# Patient Record
Sex: Male | Born: 1960 | Race: Black or African American | Hispanic: No | Marital: Single | Smoking: Current every day smoker
Health system: Southern US, Community
[De-identification: ages and names within clinical notes are randomized; demographics above are authoritative.]

## PROBLEM LIST (undated history)

## (undated) ENCOUNTER — Emergency Department: Payer: Medicare Other | Source: Home / Self Care

## (undated) ENCOUNTER — Emergency Department (HOSPITAL_COMMUNITY): Admission: EM | Payer: Self-pay | Source: Home / Self Care

## (undated) ENCOUNTER — Emergency Department: Admission: EM | Payer: Self-pay | Source: Home / Self Care

## (undated) DIAGNOSIS — K219 Gastro-esophageal reflux disease without esophagitis: Secondary | ICD-10-CM

## (undated) DIAGNOSIS — Z5900 Homelessness unspecified: Secondary | ICD-10-CM

## (undated) DIAGNOSIS — F319 Bipolar disorder, unspecified: Secondary | ICD-10-CM

## (undated) DIAGNOSIS — G4733 Obstructive sleep apnea (adult) (pediatric): Secondary | ICD-10-CM

## (undated) DIAGNOSIS — I10 Essential (primary) hypertension: Secondary | ICD-10-CM

## (undated) DIAGNOSIS — J449 Chronic obstructive pulmonary disease, unspecified: Secondary | ICD-10-CM

## (undated) DIAGNOSIS — E079 Disorder of thyroid, unspecified: Secondary | ICD-10-CM

---

## 2005-09-02 ENCOUNTER — Inpatient Hospital Stay (HOSPITAL_COMMUNITY): Admission: EM | Admit: 2005-09-02 | Discharge: 2005-09-10 | Payer: Self-pay | Admitting: Psychiatry

## 2005-09-03 ENCOUNTER — Inpatient Hospital Stay (HOSPITAL_COMMUNITY): Admission: EM | Admit: 2005-09-03 | Discharge: 2005-09-05 | Payer: Self-pay | Admitting: *Deleted

## 2005-09-06 ENCOUNTER — Ambulatory Visit: Payer: Self-pay | Admitting: Psychiatry

## 2005-10-09 ENCOUNTER — Emergency Department (HOSPITAL_COMMUNITY): Admission: EM | Admit: 2005-10-09 | Discharge: 2005-10-09 | Payer: Self-pay | Admitting: Emergency Medicine

## 2005-12-05 ENCOUNTER — Emergency Department (HOSPITAL_COMMUNITY): Admission: EM | Admit: 2005-12-05 | Discharge: 2005-12-05 | Payer: Self-pay | Admitting: Emergency Medicine

## 2005-12-27 ENCOUNTER — Emergency Department (HOSPITAL_COMMUNITY): Admission: EM | Admit: 2005-12-27 | Discharge: 2005-12-28 | Payer: Self-pay | Admitting: Emergency Medicine

## 2006-03-03 ENCOUNTER — Emergency Department (HOSPITAL_COMMUNITY): Admission: EM | Admit: 2006-03-03 | Discharge: 2006-03-03 | Payer: Self-pay | Admitting: Emergency Medicine

## 2006-07-19 ENCOUNTER — Emergency Department (HOSPITAL_COMMUNITY): Admission: EM | Admit: 2006-07-19 | Discharge: 2006-07-19 | Payer: Self-pay | Admitting: Emergency Medicine

## 2006-08-24 ENCOUNTER — Emergency Department (HOSPITAL_COMMUNITY): Admission: EM | Admit: 2006-08-24 | Discharge: 2006-08-25 | Payer: Self-pay | Admitting: Emergency Medicine

## 2006-10-30 IMAGING — CR DG CHEST 1V PORT
1 series · 1 of 1 positions shown · non-contrast
Comparison: No prior exams for comparison.

CLINICAL DATA: 44-year-old with depression, respiratory distress, fever, confusion.
 PORTABLE CHEST ? 1 VIEW:

[view not recorded]
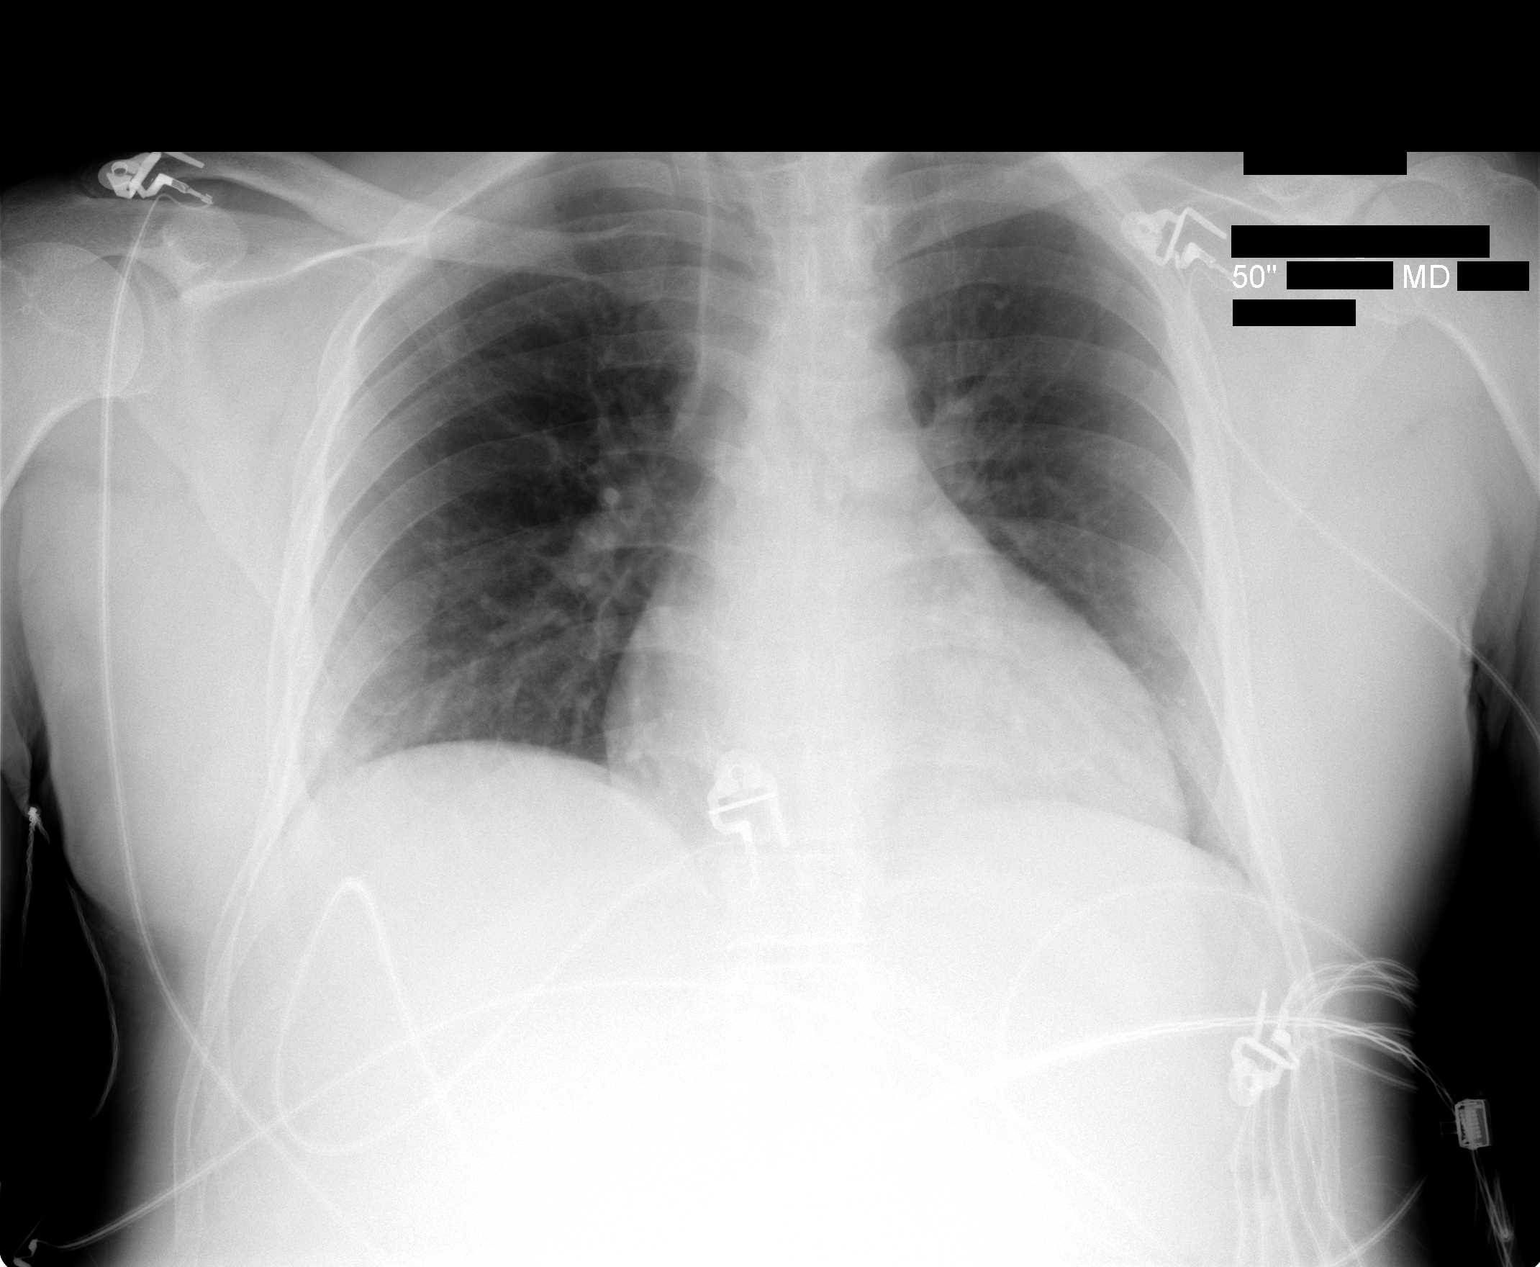

[1 of 1 positions shown; findings below may reference images not displayed]

FINDINGS: The heart size is accentuated by the technique.   There is minimal patchy density at the right lung base.   No pleural effusions or evidence for pulmonary edema.
IMPRESSION: Suspect early infiltrate, right lower lobe.

## 2006-10-31 IMAGING — CR DG CHEST 1V PORT
1 series · 1 of 1 positions shown · non-contrast
Comparison: 09/03/05.

CLINICAL DATA: 44-year-old, with depression, respiratory distress, right lower lobe infiltrate.
 PORTABLE ? 1 VIEW CHEST:

[view not recorded]
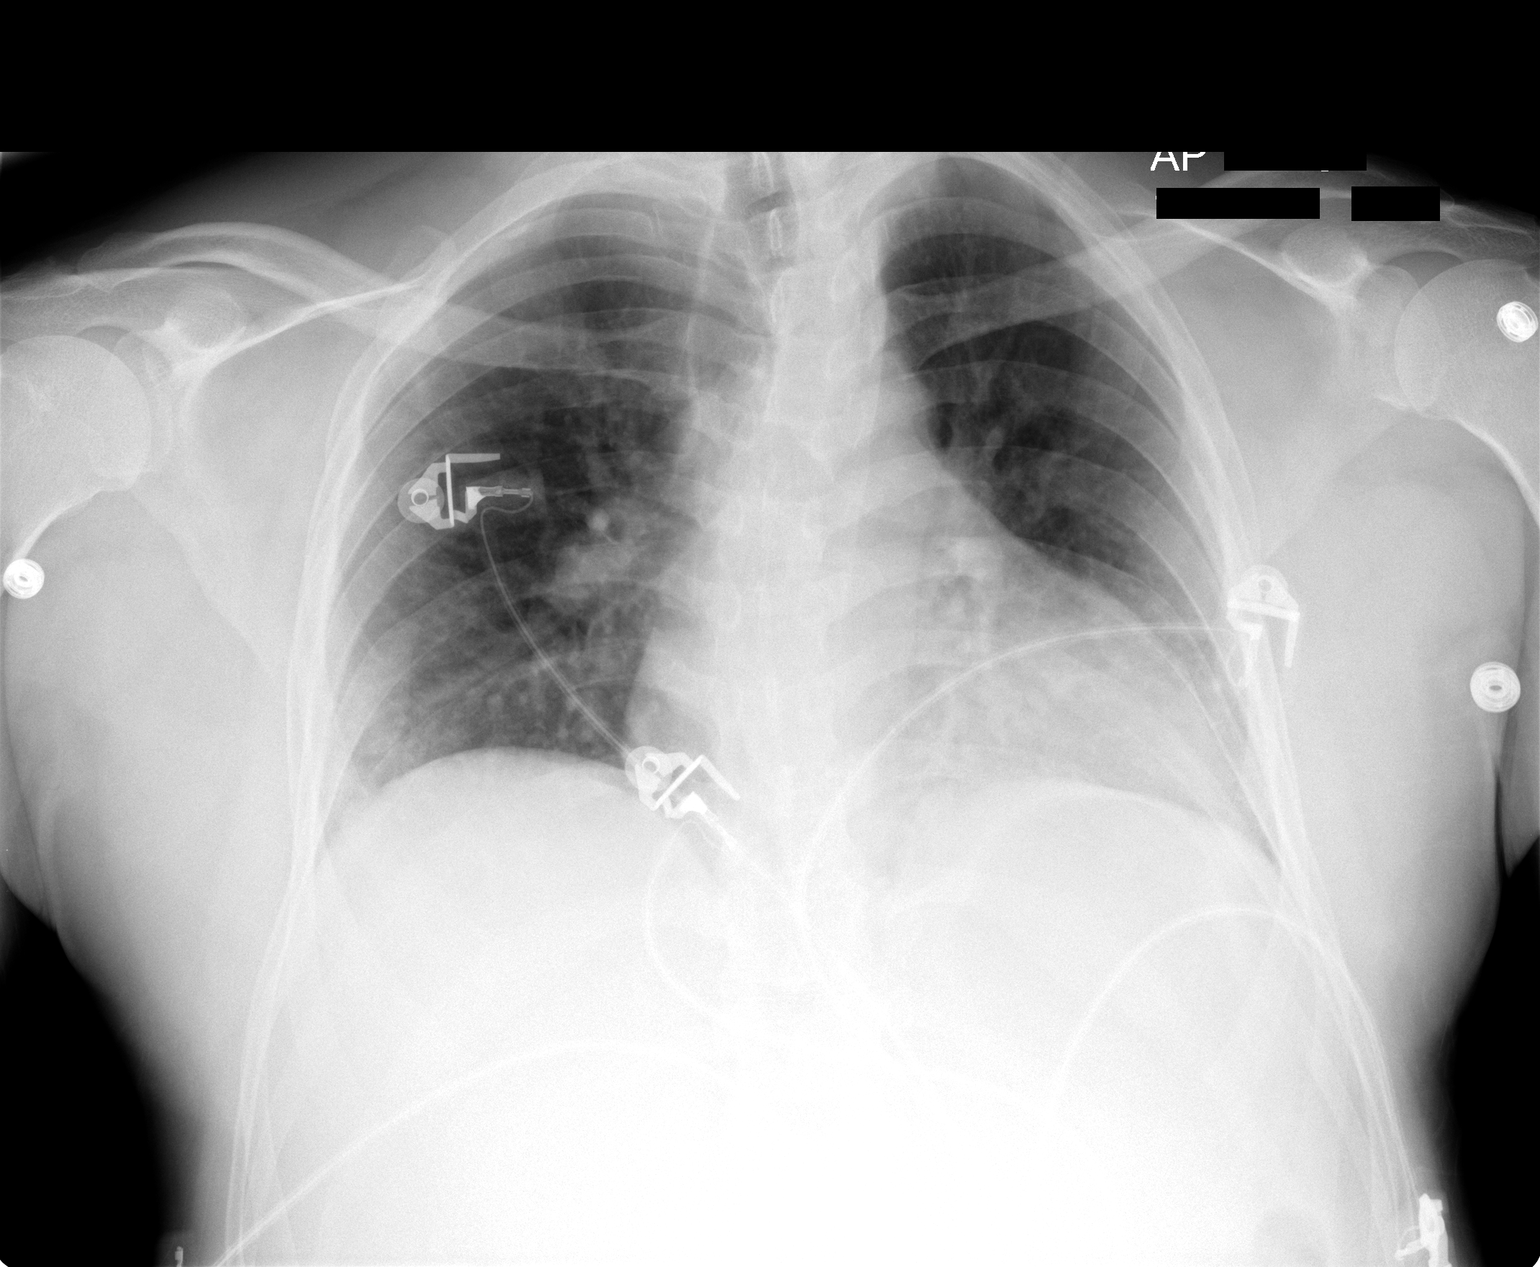

[1 of 1 positions shown; findings below may reference images not displayed]

FINDINGS: Heart size is accentuated by technique.  There are minimal patchy densities at the lower lobes bilaterally, accentuated by a shallow lung inflation.  There continues to be question of a developing infiltrate at the right lung base but now also a question of infiltrate on the left.  No evidence for pulmonary edema.
IMPRESSION: Question of developing infiltrates bilaterally.

## 2007-01-08 ENCOUNTER — Emergency Department (HOSPITAL_COMMUNITY): Admission: EM | Admit: 2007-01-08 | Discharge: 2007-01-08 | Payer: Self-pay | Admitting: Emergency Medicine

## 2007-02-22 IMAGING — CR DG CHEST 2V
2 series · 2 of 2 positions shown · non-contrast
Comparison: 10/09/05.

CLINICAL DATA: Chest pain and pressure.  Cough.  
 CHEST - 2 VIEW:

[w chest pa]
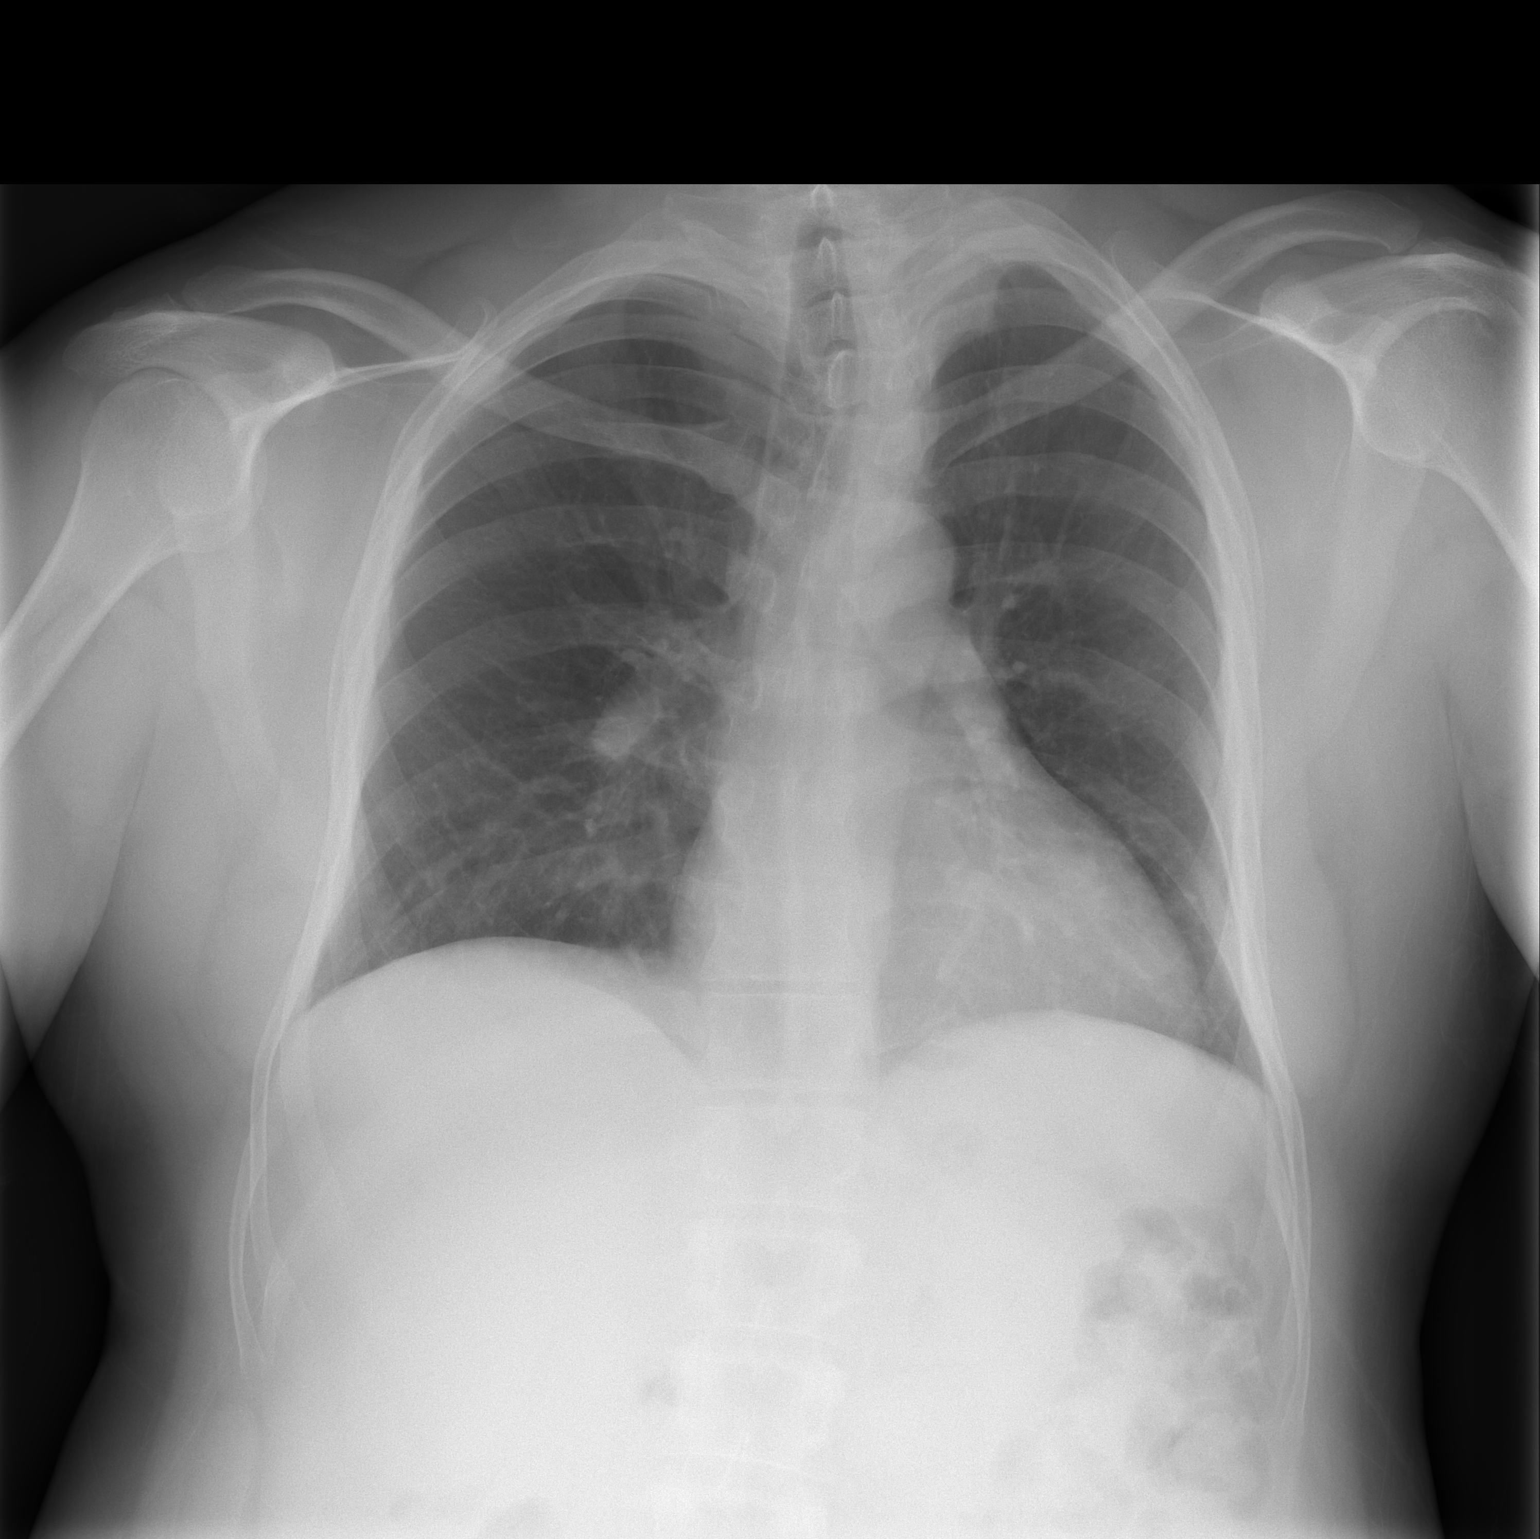

[w chest lat]
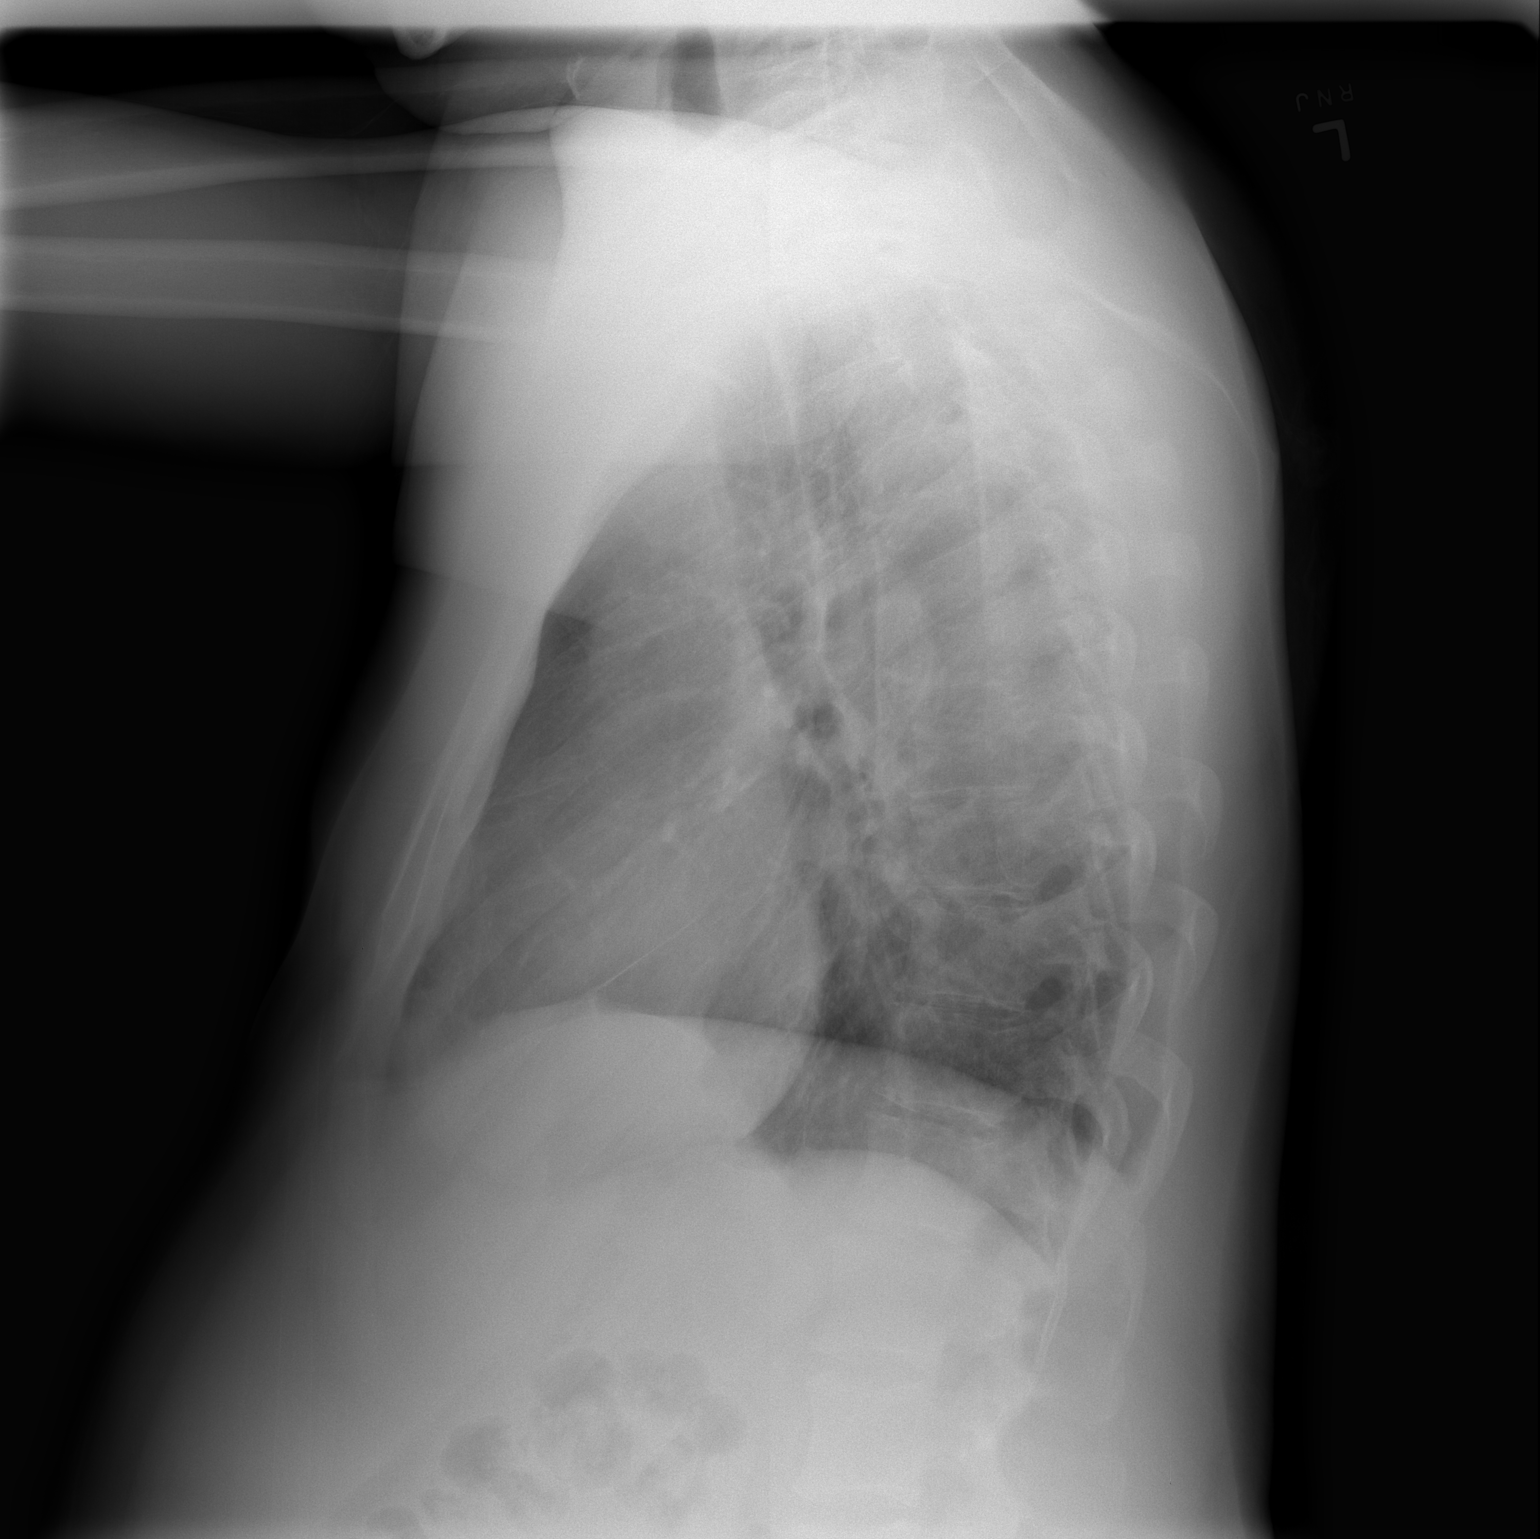

[2 of 2 positions shown; findings below may reference images not displayed]

The heart size and mediastinal contours are within normal limits.  Both lungs are clear.  The visualized skeletal structures are unremarkable.
IMPRESSION: No active cardiopulmonary disease.

## 2007-04-10 ENCOUNTER — Emergency Department (HOSPITAL_COMMUNITY): Admission: EM | Admit: 2007-04-10 | Discharge: 2007-04-10 | Payer: Self-pay | Admitting: Emergency Medicine

## 2007-04-22 ENCOUNTER — Emergency Department (HOSPITAL_COMMUNITY): Admission: EM | Admit: 2007-04-22 | Discharge: 2007-04-23 | Payer: Self-pay | Admitting: Emergency Medicine

## 2007-04-23 ENCOUNTER — Emergency Department (HOSPITAL_COMMUNITY): Admission: EM | Admit: 2007-04-23 | Discharge: 2007-04-23 | Payer: Self-pay | Admitting: Emergency Medicine

## 2007-04-30 ENCOUNTER — Emergency Department (HOSPITAL_COMMUNITY): Admission: EM | Admit: 2007-04-30 | Discharge: 2007-04-30 | Payer: Self-pay | Admitting: Emergency Medicine

## 2007-05-19 ENCOUNTER — Emergency Department (HOSPITAL_COMMUNITY): Admission: AC | Admit: 2007-05-19 | Discharge: 2007-05-19 | Payer: Self-pay

## 2007-05-20 ENCOUNTER — Emergency Department (HOSPITAL_COMMUNITY): Admission: EM | Admit: 2007-05-20 | Discharge: 2007-05-21 | Payer: Self-pay | Admitting: Emergency Medicine

## 2007-06-17 ENCOUNTER — Emergency Department (HOSPITAL_COMMUNITY): Admission: EM | Admit: 2007-06-17 | Discharge: 2007-06-18 | Payer: Self-pay | Admitting: Emergency Medicine

## 2008-03-18 ENCOUNTER — Emergency Department (HOSPITAL_COMMUNITY): Admission: EM | Admit: 2008-03-18 | Discharge: 2008-03-18 | Payer: Self-pay | Admitting: Emergency Medicine

## 2008-07-12 ENCOUNTER — Ambulatory Visit (HOSPITAL_COMMUNITY): Admission: RE | Admit: 2008-07-12 | Discharge: 2008-07-12 | Payer: Self-pay | Admitting: Gastroenterology

## 2008-08-01 ENCOUNTER — Emergency Department (HOSPITAL_COMMUNITY): Admission: EM | Admit: 2008-08-01 | Discharge: 2008-08-01 | Payer: Self-pay | Admitting: Emergency Medicine

## 2010-10-22 ENCOUNTER — Emergency Department (HOSPITAL_COMMUNITY)
Admission: EM | Admit: 2010-10-22 | Discharge: 2010-10-22 | Payer: Self-pay | Source: Home / Self Care | Admitting: Emergency Medicine

## 2011-03-12 NOTE — H&P (Signed)
NAMETAGEN, MILBY NO.:  [id number]   MEDICAL RECORD NO.:  [id number]          PATIENT TYPE:  IPS   LOCATION:  0401                          FACILITY:  [facility]   PHYSICIAN:  Jeanice Lim, M.D. DATE OF BIRTH:  Sep 08, 1961   DATE OF ADMISSION:  09/05/2005  DATE OF DISCHARGE:                         PSYCHIATRIC ADMISSION ASSESSMENT   IDENTIFYING INFORMATION:  This is a 50 year old African-American male who is  single.  This is an involuntary admission.   HISTORY OF PRESENT ILLNESS:  This 50 year old disabled father with a history  of bipolar disorder and some history of alcohol abuse, has just been  transferred from the medicine unit where he remained for 2 days after having  been stabilized for obstructive sleep apnea.  His pulse oximetry was running  about 76% at night and we had been unstable to stabilize him on the  psychiatric unit.  He was transferred to internal medicine on November 10  after having been admitted to the psychiatric unit on November 9.  A full  prior psychiatric admit note is available for his admission dated November  9.  He had been transferred from the emergency room at [facility]  where he had presented with some bizarre behavior and had admitted to  drinking some alcohol.  He had significant thought blocking and some bizarre  behaviors, sitting down in the middle of the hall, being unable to register  spoken words or commands.  We were concerned about him having an  exacerbation of his bipolar disorder, possibly in a manic state, but also  the possibility of alcohol withdrawal, had restarted him on his Risperdal 4  mg p.o. q.h.s. along with a Librium detox protocol.  He became quite  sedated.  He did not have his C-PAP here on the unit and was saturating at  76% that night.  We attempted to stabilize him here on the unit with a C-PAP  setup, but ended up transferring him to a step-down unit so they could fit  him for a full face  mask.  Additional psychiatric history is available on  the previous note.  Today, he is a well-nourished, well-developed male with  no new physical findings.  He developed manic behavior while on the medicine  unit and remains manic today.   CURRENT DIAGNOSTIC STUDIES:  WBC 5.5, hemoglobin 11.3, hematocrit 35.7,  platelets 272,000.  Sodium 139, potassium 4.1, chloride 102, CO2 29, BUN 12,  creatinine 1.3, glucose 121, and that is a random glucose.  SGOT 44, SGPT  44, alkaline phosphatase 82, and total bilirubin 0.3.  His TSH was normal at  2.751, T4 1.19, and free T3 3.6.  Hemoglobin A1C was 7.0.   MEDICATIONS ON ADMISSION TO THE UNIT:  The same as on the previous record.  However because he became sedated on the other unit on the Risperdal, they  started him on Geodon at 20 mg p.o. t.i.d.  On returning here we have  started him on Risperdal 2 mg p.o. daily and 2 mg p.o. q.6 p.m.  Also, chest  x-ray done on medicine unit  on the evening of admission, on November 11 he  did develop a fever and was shown to have some patchy infiltrate in his  lungs and was started on Augmentin 875 mg p.o. b.i.d. x6 days.  It should be  noted that the patient had reported that he had increased problems managing  his mood and sleep at night after his physicians changed him to Risperdal 4  mg p.o. q.h.s. about 3 months ago.  Prior to that he was on 2 mg in the  morning and 2 mg in the late afternoon.  We have discontinued the Librium  and he has shown no signs of alcohol withdrawal.   MENTAL STATUS EXAM:  Well-nourished, well-developed male dressed in a  hospital gown, with bright affect.  Speech is pressured, hyper verbal,  impulsive with staccato type speech.  He is spontaneously giggling and  laughs at the answer to every question, repeats questions 2 and 3 times  impulsively, having some flight of ideas, but at this point he is  cooperative and directable.  Mood is elevated, a bit grandiose.  Thought   process reflects flight of ideas.  Has been having now and then some ideas  of possibly hurting himself or other people, which he describes kind of  vaguely.  He says if he is distracted and he is not getting enough sleep, he  is beginning to get ideas of hurting someone, but he has not been acting  out, has been directable and cooperative with staff.  Cognitively he is  intact  to person and place.  He is a little bit vague on his situation, not  clear on time frames.  Insight poor, impulse control and judgment guarded.  Intellect limited.   ADMISSION DIAGNOSES:  AXIS I:  Bipolar disorder not otherwise specified,  alcohol abuse intermittently by history.  AXIS II:  Deferred.  AXIS III:  Obstructive sleep apnea, hypertension, hypothyroidism,  gastroesophageal reflux disease, and allergic rhinitis.  AXIS IV:  Deferred.  AXIS V:  Current 25, past year 27-62.   INITIAL PLAN OF CARE:  Plan is to involuntarily admit the patient with q.15  minute checks in place.  We have got him on our Intensive Care Unit and  currently we are leaving him on the Geodon 20 mg t.i.d., have also restarted  his Risperdal at 2 mg in the morning and 2 mg at 6 p.m.  We are going to  keep him on a C-PAP continuously whenever sleeping.  He does not need it  while awake, and we are going to check pulse oximetry on him every 3 hours  around the clock.  If he desaturates again but less than 91%, we have asked  the nurses to get a full set of vital signs, start him on O2 at 2 liters per  minute and notify the M.D. on call.  Meanwhile, we will watch him carefully.  We are continuing him Augmentin and we will work to stabilize his mood and  alleviate his agitated thoughts.   ESTIMATED LENGTH OF STAY:  5 days.      Margaret A. Lorin Picket, N.P.      Jeanice Lim, M.D.  Electronically Signed    MAS/MEDQ  D:  09/06/2005  T:  09/06/2005  Job:  [id number]

## 2011-03-12 NOTE — Discharge Summary (Signed)
NAME:  [name], [name] NO.:  [id number]   MEDICAL RECORD NO.:  [id number]          PATIENT TYPE:  IPS   LOCATION:  0401                          FACILITY:  [facility]   PHYSICIAN:  Jeanice Lim, M.D. DATE OF BIRTH:  Jul 30, 1961   DATE OF ADMISSION:  09/02/2005  DATE OF DISCHARGE:  09/03/2005                                 DISCHARGE SUMMARY   IDENTIFYING DATA:  A 50 year old African-American male, single,  involuntarily admitted, with a history of bipolar disorder and regular  alcohol use, who presented to emergency room complaining of voices actually  telling him he needed help.   PAST PSYCHIATRIC HISTORY:  First admission to [facility].  Has  a history of several hospitalizations at [facility], [facility] and [facility], and last admission being in July 2006 at [facility].  The  patient currently followed by ACT team in [facility].  History of  regular alcohol use, but amount is unclear.  The patient was somewhat  bizarre and psychotic and poor historian at time of admission.   ADMISSION MEDICATIONS:  Norvasc, Lotensin, hydrochlorothiazide, Risperdal,  Flonase, Allegra, multivitamin, thiamine, folic acid, Synthroid, and  Protonix.   ALLERGIES:  No known drug allergies.   PHYSICAL AND NEUROLOGICAL EXAMINATION:  Essentially within normal limits  except for mildly elevated blood pressure.   ROUTINE ADMISSION LABS:  Essentially within normal limits.  Urine drug  screen negative.  Alcohol level was not done, although small amount of  protein in urine.   MENTAL STATUS EXAM:  Fully alert male, staring, considerable latency in  response but then would answer without problems, not pressured.  Poverty of  thought.  Affect a bit anxious and some tremulousness.  Mood was mostly  stable.  Thought process appearing to be thought blocking, appearing  internally distracted, possibly worsened from withdrawal symptoms.  No  flight of ideas or  manic symptoms.  Cognitively intact.  Judgment and  insight impaired.  Poor historian.  Perplexed about medications and why he  was here, showing poor insight.   ADMITTING DIAGNOSES:  AXIS I:  Rule out bipolar disorder, not otherwise  specified, and alcohol dependence, partial withdrawal syndrome, possible  alcohol-induced mood disorder superimposed on bipolar disorder versus  schizoaffective disorder, bipolar type.  AXIS II:  None.  AXIS III:  History of chronic obstructive pulmonary disease, hypothyroidism,  gastroesophageal reflux disease, hypertension, allergic rhinitis.  AXIS IV:  Moderate stressors related to medical problems and limited support  system.  AXIS V:  15/55.   HOSPITAL COURSE:  The patient was admitted and ordered routine p.r.n.  medications; underwent further monitoring; and was encouraged to participate  in individual, group and milieu therapy. The patient was resumed on  Risperdal, targeting psychotic symptoms, and placed on low-dose Librium  detox for safe withdrawal, monitored closely.  Pulse oximetry was checked  due to the patient's clear obstructive breathing pattern, which was  consistent with severe obstructive sleep apnea.  Internal medicine was  consulted regarding sleep apnea, and a stat ABG was ordered as well as BMET.  The patient was  started on one to one and medications were held due to  concern about the patient's obstructive breathing pattern and O2 was  provided, and the patient was transferred to ER for medical evaluation and  possible admission to further evaluate intervention needed for obstructive  sleep apnea for medical clearance before possibly returning to this  freestanding psychiatric facility for detox or stabilization on psychotropic  safely.  The patient was discharged in improved condition.  GAF was 45 at  time of transfer.   DISCHARGE DIAGNOSES:  AXIS I:  Rule out bipolar disorder, not otherwise  specified, and alcohol dependence,  partial withdrawal syndrome, possible  alcohol-induced mood disorder superimposed on bipolar disorder versus  schizoaffective disorder, bipolar type.  AXIS II:  None.  AXIS III:  History of chronic obstructive pulmonary disease, hypothyroidism,  gastroesophageal reflux disease, hypertension, allergic rhinitis.  AXIS IV:  Moderate stressors related to medical problems, limited support  system.  AXIS V:  Global assessment of function on transfer 45.   DISPOSITION:  Discharged to emergency room to continue medications after  respiratory therapy or internal medicine evaluates the patient's significant  apnea and pulse oximetries which dropped into the 70s at times to ensure  safe use of psychotropics and further stabilization.      Jeanice Lim, M.D.  Electronically Signed     JEM/MEDQ  D:  09/16/2005  T:  09/16/2005  Job:  [id number]

## 2011-03-12 NOTE — Discharge Summary (Signed)
NAME:  [name], [name] NO.:  [id number]   MEDICAL RECORD NO.:  [id number]          PATIENT TYPE:  IPS   LOCATION:  0405                          FACILITY:  [facility]   PHYSICIAN:  Jeanice Lim, M.D. DATE OF BIRTH:  [birth year]   DATE OF ADMISSION:  09/05/2005  DATE OF DISCHARGE:  09/10/2005                                 DISCHARGE SUMMARY   IDENTIFYING DATA:  This is a 50 year old male, returning from internal  medicine unit after stabilization and evaluation of sleep apnea.  This is a  50 year old African-American male, single, involuntarily admitted, history  of bipolar disorder with history of alcohol abuse, transferred from medicine  unit, remained 2 days after being stabilized for obstructive sleep apnea.  Pulse oximetry was running 76% at night and he was found to be unstable and  transferred from psychiatric unit to internal medicine on November 10, after  being admitted here on November 9, being transferred from emergency room at  [facility] where he had presented with bizarre behavior, admitted to  drinking alcohol.  He had significant thought blocking and bizarre  behaviors, such as sitting down in middle of hall and being unable register  spoken words or commands, thought disordered and appearing confused.  He was  not on C-PAP on the unit, and again was saturating at 76% at night.  He  needed to be transferred to step-down unit so they could fit him for a full  face mask.  Routine admission labs were essentially within normal limits.  TSH was 2.7.  Liver function tests, SGOT and SGPT 44 and 44.  BUN and  creatinine 12 and 1.3 respectively, and glucose 121.  Medications on the  medical unit:  He was started on Geodon 20 mg t.i.d., apparently became  sedated with the Risperdal and we had started him on the Risperdal after he  came back to [facility], also had chest x-ray done showing  patchy infiltrate in his lungs and he was started on  Amoxicillin.  He had no  evidence of alcohol withdrawal syndrome so Librium was stopped and to be  noted he had problems after his physician changed him from 2 mg of Risperdal  b.i.d. to 4 mg at night.   MENTAL STATUS EXAM:  Well-nourished, well-developed male, dressed in  hospital gown, bright affect.  Speech: Pressured, hyper-verbal, impulsive.  Speech out of context, intrusive, and spontaneously giggling laughing,  otherwise polite and re-directable.  Mood was a bit elevated and grandiose.  Thought processes: Some flight of ideas and sense of entitlement, as well as  psychomotor restlessness..  Cognition was intact.  Judgment and insight were  impaired and he denied any suicidal or homicidal thoughts or command  hallucinations.   ADMISSION DIAGNOSES:  AXIS I:  Bipolar disorder not otherwise specified,  alcohol abuse intermittent, rule out a mood disorder secondary to general  medical conditions, sleep apnea and hypoxia at night.  AXIS II:  Deferred.  AXIS III:  Severe obstructive sleep apnea, hypertension, hypothyroidism,  gastroesophageal reflux disease, allergic rhinitis history.  AXIS IV:  Deferred.  AXIS V:  25/55.   HOSPITAL COURSE:  The patient was admitted and ordered routine p.r.n.  medications, underwent further monitoring, and was encouraged to participate  in individual, group and milieu therapy. The patient was monitored very  closely regarding pulse oximetry and C-PAP was used and sats were checked  periodically.  He was resumed on Risperdal to stabilize mood and thoughts  and reported a positive response, doing well, with decrease in thought  blocking, improvement in goal directed behavior, and became more appropriate  on the unit and was discharged in improved condition, with no dangerous  ideation, no side effects from the medications, more active and clear during  the day and able to sleep well at night with a C-PAP without complaints.  The patient was discharged  in improved condition with no dangerous ideation  or risk issues, given medication education, to continue C-PAP with O2 and  mask as prescribed and as per followup with primary care physician and  respiratory therapist if needed.  He may require surgery due to severity of  his obstruction.  The patient has already been seen by ENT.  He was given  medication education and discharged on:  1.  Nasonex 2 sprays each day.  2.  Risperdal 2 mg tab, one in the morning and 1.5 at 8 p.m.  3.  Augmentin 875 twice a day for 2 more days.  4.  Levothyroxine 100 mcg q.a.m.  5.  Trazodone 50 mg q.8 p.m. p.r.n. insomnia, to use with caution and not to      take if not using the C-PAP.  6.  Norvasc 10 mg q.a.m.  7.  Lotensin 20 mg q.a.m.  8.  Protonix 40 mg q.a.m.  9.  Hydrochlorothiazide 25 mg q.a.m.  10. Depakote ER 500 mg b.i.d.   DISPOSITION:  The patient is to follow up with [facility] with Dr.  Raquel James 09/15/2005 at 12 noon.   DISCHARGE DIAGNOSES:  AXIS I:  Bipolar disorder not otherwise specified,  alcohol abuse intermittent, rule out a mood disorder secondary to general  medical conditions, sleep apnea and hypoxia at night.  AXIS II:  Deferred.  AXIS III:  Severe obstructive sleep apnea, hypertension, hypothyroidism,  gastroesophageal reflux disease, allergic rhinitis history.  AXIS IV:  Deferred.  AXIS V:  Global assessment of function on discharge was 55.      Jeanice Lim, M.D.  Electronically Signed     JEM/MEDQ  D:  09/19/2005  T:  09/19/2005  Job:  [id number]

## 2011-03-12 NOTE — Discharge Summary (Signed)
NAME:  [name], [name]                ACCOUNT NO.:  [id number]   MEDICAL RECORD NO.:  [id number]          PATIENT TYPE:  INP   LOCATION:  0162                         [facility]  [facility]   PHYSICIAN:  Corinna L. Lendell Caprice, MDDATE OF BIRTH:  September 21, 1961   DATE OF ADMISSION:  09/03/2005  DATE OF DISCHARGE:  09/05/2005                                 DISCHARGE SUMMARY   DISCHARGE DIAGNOSES:  1.  Obstructive sleep apnea.  2.  Acute mania with history of bipolar disorder.  3.  History of alcohol abuse/dependence.  4.  Hypertension.  5.  Hypothyroidism.  6.  Gastroesophageal reflux disease.  7.  Allergic rhinitis.  8.  Fever, possibly pulmonary source, resolved.   DISCHARGE MEDICATIONS:  1.  He will need CPAP nightly or while asleep. I recommend trying to      minimize sedating medications if at all possible.  2.  He is currently on Geodon 20 mg p.o. t.i.d.  3.  Norvasc 10 mg a day.  4.  Lotensin 20 mg a day.  5.  Protonix 40 mg a day.  6.  Hydrochlorothiazide 25 mg a day.  7.  Nasonex two sprays per nostril per day.  8.  Synthroid 100 mcg a day.  9.  Risperidone 2 mg in the morning, 2 mg in the evening.  10. Augmentin 875 mg one p.o. b.i.d. for 6 days.   LABORATORY DATA:  ABG on room air showed a pH of 7.381, pCO2 50, pO2 66.  Basic metabolic panel was fairly unremarkable as were LFT's.  Hemoglobin A1C  was 7, cholesterol 202, LDL 123, HDL 49, TSH 2.751, free T4 1.19, free T3  3.6.  UA was negative for nitrites or leukocyte esterase, 100 protein, no  blood.  CBC was significant for hemoglobin of 11.3, hematocrit of 35,  otherwise unremarkable.   SPECIAL STUDIES AND RADIOLOGY:  Chest x-ray showed possibly early infiltrate  right lower lobe on November 10.  Repeat chest x-ray on November 12, showed  shallow inflation, but could not rule out bilateral infiltrates.   CONSULTATIONS:  Anselm Jungling, M.D.   HOSPITAL COURSE:  [name] is a 50 year old black male who had been at  [facility] where he is being treated for mania.  Apparently,  he received some benzodiazepines and antipsychotics and became hypoxic.  He  has a history of obstructive sleep apnea and when awake and alert or when on  CPAP while asleep, he had no problems with saturation.  The Hospitalists  were consulted for the hypoxia and we transferred him to [facility] for observation.  The patient had no problems with  hypoxia while awake, but he would occasionally dip down into the 80s when he  was asleep.  This did not occur when he was on CPAP.  I am told that he uses  CPAP at home and has a documented diagnosis of obstructive sleep apnea.  I  feel that this does not represent a new problem and that the patient can be  transferred safely back to behavioral health for psychiatric treatment  as  long as he gets his CPAP at nighttime and while asleep.   He developed a fever the night of admission.  His UA was negative.  Blood  cultures to date are negative.  The patient did not have a cough or any  abnormal breath sounds, but there was a question of possible infiltrate on  the x-ray of the chest.  He received Zosyn while here and I have switched  this to Augmentin.  It is not entirely certain whether he does have  pneumonia, however, I will go ahead and treat with Augmentin 875 mg p.o.  b.i.d. for 6 more days.  His blood pressure remained stable during his  hospitalization.   He remained quite manic here throughout his stay.  Dr. Electa Sniff was consulted  and ordered Geodon.  I would recommend trying to avoid sedating medications  as much as possible as this can worsen sleep apnea.  Please see Vella Raring, M.D. consult note for complete admission details.      Corinna L. Lendell Caprice, MD  Electronically Signed     CLS/MEDQ  D:  09/05/2005  T:  09/05/2005  Job:  [id number]   cc:   Anselm Jungling, MD

## 2011-03-12 NOTE — Consult Note (Signed)
NAME:  [name], [name]                ACCOUNT NO.:  [id number]   MEDICAL RECORD NO.:  [id number]          PATIENT TYPE:  IPS   LOCATION:  0401                          FACILITY:  [facility]   PHYSICIAN:  Vella Raring, MD DATE OF BIRTH:  [birth year]   DATE OF CONSULTATION:  09/03/2005  DATE OF DISCHARGE:                                   CONSULTATION   REASON FOR CONSULTATION:  The patient was hypoxic to 76% last night.   HOSPITAL COURSE:  The patient is a 50 year old black male with a history of  alcohol use and bipolar disorder, who was recently transferred to the  [facility].  He also has a history of sleep apnea which  appears anatomic.  The patient has a deviated nasal septum.  The patient has  been on C-PAP for approximately five years, he states.  Overnight, the  patient was started on Risperdal and given some Librium secondary to his  history of alcohol abuse to prevent possible withdrawal.  The patient did  not have his C-PAP overnight and it was noted that his O2 sat had dropped to  76%.  The patient, this morning, was aroused and stimulated and walking  around.  When the patient was aroused and stimulated and walking around, his  O2 sats are above 94%.  The patient continues to be very lethargic and  drowsy and needs stimulation or else he will continue to fall asleep.  We  were consulted concerning management of his sleep apnea.  At the time of my  consultation, respiratory was assessing the patient for C-PAP.  It appears  the patient is a mouth breather and would not tolerate a nasal mask.  The  patient would do better with a full-face mask.  However, this would require  close monitoring.  Per respiratory, they will try a chin strap before having  to resort to a full-face mask.  When I arrived for consultation, the patient  was walking up and down the hallway for stimulation but is not standing well  with stimulation.  The patient also has one-on-one to monitor  his lethargy  and respiratory status.  It appears the patient did receive Risperdal and  Librium overnight.   PAST MEDICAL HISTORY:  1.  Bipolar disorder.  2.  History of alcohol abuse.  3.  Sleep apnea.  4.  Gastroesophageal reflux disease.  5.  Allergic rhinitis.  6.  Hypertension.   SOCIAL HISTORY:  The patient is single.  He lives alone.  He is on  disability.  He has a child who is raised by his mother.  The patient admits  to alcohol abuse.   FAMILY HISTORY:  Unable to obtain.   MEDICATIONS:  On admission:  1.  Risperdal 2 mg x1.  2.  Ativan 2 mg x1.  3.  Haldol 5 mg IM q.6h. p.r.n. for agitation.  4.  Librium protocol.  5.  Thiamine IM.  6.  Cogentin.  At home medications obtained from the chart were:  1.  Norvasc 10 mg.  2.  Lotensin 20 mg.  3.  Hydrochlorothiazide 25 mg.  4.  Risperdal 2 mg b.i.d.  5.  Flonase 50 mcg, 2 sprays.  6.  Allegra 60 mg b.i.d.  7.  Protonix 40 mg daily.  8.  Synthroid 100 mcg daily.   ALLERGIES:  No known drug allergies.  Again, obtained from the chart.   REVIEW OF SYSTEMS:  Unable to obtain from patient.  The patient is very  lethargic and not able to answer questions until patient is continually  stimulated.   PHYSICAL EXAMINATION:  The patient had a blood pressure of 118/65, pulse  106, sat 96% on room air, he is afebrile at 98.5, respirations is 32.  The  patient is lying in his bed.  The patient is very lethargic and breathing  through his mouth.  HEENT:  Normocephalic and atraumatic.  The patient has dry mucous membranes.  CVS:  S1 and S2 regular rate and rhythm.  RESPIRATORY:  Lungs are clear to auscultation bilaterally.  ABDOMEN:  Normal active bowel sounds.  Abdomen soft, nontender,  nondistended.  EXTREMITIES:  The patient has no edema.  NEURO:  The patient was, again, very sleepy.  He will only answer questions  appropriately if continuously shaken or stimulated.   LABORATORY DATA:  Only labs available are LFT test  which shows total  bilirubin 0.3, direct bili 0.1, AST 44, ALT 24.   ASSESSMENT AND PLAN:  This is a 50 year old male with sleep apnea secondary  to anatomic obstruction.  Recommendations to withhold any sedatives, no  Librium.  The patient should not have Librium or Ativan or any other  sedatives.  The patient needs C-PAP.  Respiratory will try a chin strap  secondary to patient being mouth breathing.  If patient cannot tolerate chin  strap, then we will have to try a full-face mask.  However, this will need  close monitoring.  The patient will have to be monitored on the full face  mask.  I agree with the patient being stimulated and walking up and down the  hallway.  The patient should have tall sock monitor.  Continue to monitor  his O2 sats.  The patient should also agree with continuing one-to-one until  C-PAP can be established.  As patient is a mouth breather, he will do better  with a Venturi face mask.  For hypertension, the patient can restart his  antihypertensives if his blood pressure is elevated.           ______________________________  Vella Raring, MD     LNB/MEDQ  D:  09/03/2005  T:  09/03/2005  Job:  [id number]

## 2011-03-12 NOTE — H&P (Signed)
NAME:  [name], [name] NO.:  [id number]   MEDICAL RECORD NO.:  [id number]          PATIENT TYPE:  IPS   LOCATION:  0401                          FACILITY:  [facility]   PHYSICIAN:  Jeanice Lim, M.D. DATE OF BIRTH:  1961-04-16   DATE OF ADMISSION:  09/02/2005  DATE OF DISCHARGE:                         PSYCHIATRIC ADMISSION ASSESSMENT   IDENTIFYING INFORMATION:  This is a 50 year old African-American male who is  single.  This is an involuntary admission.   HISTORY OF PRESENT ILLNESS:  This 50 year old disabled father with a history  of bipolar disorder and regular alcohol use presented himself in the  emergency room today saying that he needed help with the voices, that voices  were actually telling him that he needed help.  At the time of initial  presentation, the emergency room report states that he believed people could  read his thoughts at that time, doing some inappropriate behaviors, bowed  down at the feet of one of the physicians who was speaking to him.  Associations were loose.  His behavior was a bit bizarre.  He was  transferred to [facility] for treatment.  At the  time of arrival here, he continued to be somewhat bizarre and inappropriate,  pulled down the curtains in the room although his behavior was not really  aggressive, sitting down in the middle of a hall to speak to me, displayed  thought blocking, internally distracted and a lot of thought processing  difficulty, but was cooperative and was attempting to confirm his medicines  but was unable to maintain a train of thought.  No evidence of any homicidal  or suicidal ideation.   PAST PSYCHIATRIC HISTORY:  This is his first admission to [facility].  He has a history of several hospitalizations at  [facility], [facility] and [facility], with apparently his  last admission being July of 2006 at [facility].  His history from  [facility] reports that he does have a history of alcohol use,  although he is unable to respond to questions appropriately.  Most of his  history is taken from the record.  His ability to participate in interview  today is quite limited.  The patient is currently followed by the ACT team  in [facility].   SOCIAL HISTORY:  This is a single African-American male who lives alone,  apparently on disability for his mental health disorder.  He has one  daughter that is being raised by her mother, apparently.  No known history  of legal problems.   FAMILY HISTORY:  Not available.   ALCOHOL AND DRUG HISTORY:  The record reports a history of regular alcohol  use but no details are available.  The patient's recent alcohol use is not  clear.   PRIMARY CARE PHYSICIAN:  Not available.   PAST MEDICAL HISTORY:  The patient has a history of GERD, obstructive sleep  apnea, allergic rhinitis and hypertension.  The patient has a history of  hypothyroidism.   MEDICATIONS:  As provided by [facility] are as follows:  1.  Norvasc 10 mg daily.  2.  Lotensin 20 mg daily.  3.  HCTZ 25 mg daily.  4.  Risperdal 2 mg b.i.d.  5.  Flonase 50 mcg 2 sprays in each nostril daily.  6.  Allegra 60 mg b.i.d.  7.  Multivitamin daily.  8.  Thiamine daily.  9.  Folic acid daily.  10. Synthroid 100 mcg daily.  11. Protonix 40 mg daily.   DRUG ALLERGIES:  No known drug allergies.   POSITIVE PHYSICAL FINDINGS:  Physical examination was done in the emergency  room at [facility].  Today he appears to be a medium-build, medium-  height African-American male, 5 feet 6-1/2 inches tall.  We were unable to  weight him at this time because of his thought distraction.  Vital signs on  admission to the unit:  Temperature 97.1, pulse 95, blood pressure 163/104  sitting and 173/103 standing.  Physical examination is noted in the record  over at [facility].   DIAGNOSTIC STUDIES:  WBC 6.4,  hematocrit 12.8, hematocrit 38.5, platelets  311,000.  Sodium 136, potassium 4.1, chloride 105, carbon dioxide 24, BUN  13, creatinine 1.3, and glucose 96.  Liver enzymes are currently pending.  TSH, T3 and free T4 are currently pending.  Urine drug screen was negative  for all substances and alcohol level was not done.  Urinalysis was normal  except his protein was noted at 30 mg/dl.   MENTAL STATUS EXAM:  Fully alert male, stares at me, considerable response  lag intermittently, other times will answer quite readily.  Speech is not  pressured but decreased in amount.  Affect is a bit anxious, no tremor, no  diaphoresis.  Hygiene is adequate.  Affect is perplexed.  Mood is stable,  euthymic.  Thought process:  Definitely thought blocking, appears to be  internally distracted although he denies hearing any voices at this time.  No flight of ideas, no ideas of reference at this time, no overt HI or SI.  Appears to be perplexed and a bit confused.  Cognitively oriented x2, person  and situation, not oriented to time.  Unable to give any clear history so  most of his history comes from the record.  Able to confirm some meds and  just looks perplexed when asked about others.   DIAGNOSIS:  AXIS I:  Rule out bipolar disorder not otherwise specified, rule  out ethyl alcohol abuse and dependence.  AXIS II:  No diagnosis.  AXIS III:  Obstructive sleep apnea, hypothyroidism, gastroesophageal reflux  disease, hypertension, and allergic rhinitis, all by history.  AXIS IV:  Deferred.  AXIS V:  Current 15, past year not known.   PLAN:  Plan is to involuntarily admit the patient to stabilize his mood and  to improve his reality orientation.  We are unable to get clear history of  his alcohol abuse and on the chance that he may be in withdrawal or having  some delirium, we are going to start with Ativan 2 mg p.o. now.  The patient is willing to take p.o. medications.  We will also give him Risperdal 2  mg  p.o. M-Tab now and will proceed to resume his previous medications.  We are  going to give him Risperdal 2 mg p.o. both every morning and at h.s., also  make available Ambien one to two 5 mg tabs h.s. p.r.n. for insomnia and we  are going to start him on a Librium protocol.  We will hold  those doses if  he is  sedated, watch him carefully, and we have got him on our intensive care  program.  He has so far been cooperative with the nurses.   ESTIMATED LENGTH OF STAY:  7 days.      Margaret A. Lorin Picket, N.P.      Jeanice Lim, M.D.  Electronically Signed    MAS/MEDQ  D:  09/02/2005  T:  09/03/2005  Job:  [id number]

## 2011-07-26 LAB — RAPID URINE DRUG SCREEN, HOSP PERFORMED
Amphetamines: NOT DETECTED
Barbiturates: NOT DETECTED
Benzodiazepines: POSITIVE — AB
Cocaine: NOT DETECTED
Opiates: NOT DETECTED
Tetrahydrocannabinol: NOT DETECTED

## 2011-07-26 LAB — POCT CARDIAC MARKERS
CKMB, poc: 1.2
CKMB, poc: 1.3
Myoglobin, poc: 63.2
Myoglobin, poc: 84.2
Troponin i, poc: <0.05
Troponin i, poc: <0.05

## 2011-07-26 LAB — URINALYSIS, ROUTINE W REFLEX MICROSCOPIC
Bilirubin Urine: NEGATIVE
Glucose, UA: NEGATIVE
Hgb urine dipstick: NEGATIVE
Ketones, ur: NEGATIVE
Nitrite: NEGATIVE
Protein, ur: NEGATIVE
Specific Gravity, Urine: 1.011
Urobilinogen, UA: 0.2
pH: 7

## 2011-07-26 LAB — LIPASE, BLOOD: Lipase: 30

## 2011-07-26 LAB — POCT I-STAT, CHEM 8
BUN: 7
Calcium, Ion: 1.26
Chloride: 102
Creatinine, Ser: 1.1
Glucose, Bld: 102 — ABNORMAL HIGH
HCT: 38 — ABNORMAL LOW
Hemoglobin: 12.9 — ABNORMAL LOW
Potassium: 3.8
Sodium: 139
TCO2: 26

## 2011-07-26 LAB — ETHANOL: Alcohol, Ethyl (B): <5

## 2011-07-26 LAB — GLUCOSE, CAPILLARY: Glucose-Capillary: 136 — ABNORMAL HIGH

## 2011-07-26 LAB — ACETAMINOPHEN LEVEL: Acetaminophen (Tylenol), Serum: <10 — ABNORMAL LOW

## 2011-07-26 LAB — VALPROIC ACID LEVEL: Valproic Acid Lvl: 105.2 — ABNORMAL HIGH

## 2011-08-06 LAB — URINALYSIS, ROUTINE W REFLEX MICROSCOPIC
Bilirubin Urine: NEGATIVE
Glucose, UA: NEGATIVE
Hgb urine dipstick: NEGATIVE
Ketones, ur: NEGATIVE
Nitrite: NEGATIVE
Protein, ur: NEGATIVE
Specific Gravity, Urine: 1.007
Urobilinogen, UA: 0.2
pH: 6

## 2011-08-09 LAB — SAMPLE TO BLOOD BANK

## 2011-08-09 LAB — POCT I-STAT CREATININE
Creatinine, Ser: 1.6 — ABNORMAL HIGH
Operator id: 272551

## 2011-08-09 LAB — I-STAT 8, (EC8 V) (CONVERTED LAB)
Acid-Base Excess: 4 — ABNORMAL HIGH
BUN: 14
Bicarbonate: 30.1 — ABNORMAL HIGH
Chloride: 99
Glucose, Bld: 122 — ABNORMAL HIGH
HCT: 41
Hemoglobin: 13.9
Operator id: 272551
Potassium: 3.2 — ABNORMAL LOW
Sodium: 134 — ABNORMAL LOW
TCO2: 32
pCO2, Ven: 48.3
pH, Ven: 7.402 — ABNORMAL HIGH

## 2011-08-10 LAB — BASIC METABOLIC PANEL
BUN: 13
CO2: 30
Calcium: 9.4
Chloride: 98
Creatinine, Ser: 1.2
GFR calc Af Amer: >60
GFR calc non Af Amer: >60
Glucose, Bld: 103 — ABNORMAL HIGH
Potassium: 3.7
Sodium: 137

## 2011-08-10 LAB — RAPID URINE DRUG SCREEN, HOSP PERFORMED
Amphetamines: NOT DETECTED
Barbiturates: NOT DETECTED
Benzodiazepines: POSITIVE — AB
Cocaine: NOT DETECTED
Opiates: NOT DETECTED
Tetrahydrocannabinol: NOT DETECTED

## 2011-08-10 LAB — CK: Total CK: 340 — ABNORMAL HIGH

## 2011-08-11 LAB — CBC
HCT: 36.2 — ABNORMAL LOW
MCHC: 33.2
Platelets: 259
RDW: 14.8 — ABNORMAL HIGH

## 2011-08-11 LAB — BASIC METABOLIC PANEL
BUN: 7
GFR calc non Af Amer: >60
Glucose, Bld: 101 — ABNORMAL HIGH
Potassium: 3.7

## 2011-08-11 LAB — URINALYSIS, ROUTINE W REFLEX MICROSCOPIC
Bilirubin Urine: NEGATIVE
Glucose, UA: NEGATIVE
Hgb urine dipstick: NEGATIVE
Ketones, ur: NEGATIVE
Nitrite: NEGATIVE
Protein, ur: 30 — AB
Specific Gravity, Urine: 1.008
Urobilinogen, UA: 0.2
pH: 6

## 2011-08-11 LAB — URINE MICROSCOPIC-ADD ON

## 2011-08-11 LAB — RAPID URINE DRUG SCREEN, HOSP PERFORMED
Amphetamines: NOT DETECTED
Cocaine: NOT DETECTED
Opiates: NOT DETECTED
Tetrahydrocannabinol: NOT DETECTED

## 2011-08-11 LAB — DIFFERENTIAL
Basophils Absolute: 0
Basophils Relative: 1
Eosinophils Absolute: 0.3
Eosinophils Relative: 4
Lymphocytes Relative: 40

## 2011-08-11 LAB — VALPROIC ACID LEVEL: Valproic Acid Lvl: 90

## 2011-09-12 ENCOUNTER — Emergency Department (HOSPITAL_COMMUNITY)
Admission: EM | Admit: 2011-09-12 | Discharge: 2011-09-12 | Disposition: A | Discharge status: Home / Self Care | Payer: Self-pay | Attending: Emergency Medicine | Admitting: Emergency Medicine

## 2011-09-12 ENCOUNTER — Encounter: Payer: Self-pay | Admitting: *Deleted

## 2011-09-12 DIAGNOSIS — R109 Unspecified abdominal pain: Secondary | ICD-10-CM | POA: Insufficient documentation

## 2011-09-12 DIAGNOSIS — G4733 Obstructive sleep apnea (adult) (pediatric): Secondary | ICD-10-CM | POA: Insufficient documentation

## 2011-09-12 DIAGNOSIS — Z7982 Long term (current) use of aspirin: Secondary | ICD-10-CM | POA: Insufficient documentation

## 2011-09-12 DIAGNOSIS — R197 Diarrhea, unspecified: Secondary | ICD-10-CM | POA: Insufficient documentation

## 2011-09-12 DIAGNOSIS — Z79899 Other long term (current) drug therapy: Secondary | ICD-10-CM | POA: Insufficient documentation

## 2011-09-12 DIAGNOSIS — R110 Nausea: Secondary | ICD-10-CM | POA: Insufficient documentation

## 2011-09-12 DIAGNOSIS — F319 Bipolar disorder, unspecified: Secondary | ICD-10-CM | POA: Insufficient documentation

## 2011-09-12 DIAGNOSIS — K219 Gastro-esophageal reflux disease without esophagitis: Secondary | ICD-10-CM | POA: Insufficient documentation

## 2011-09-12 DIAGNOSIS — I10 Essential (primary) hypertension: Secondary | ICD-10-CM | POA: Insufficient documentation

## 2011-09-12 HISTORY — DX: Disorder of thyroid, unspecified: E07.9

## 2011-09-12 HISTORY — DX: Obstructive sleep apnea (adult) (pediatric): G47.33

## 2011-09-12 HISTORY — DX: Essential (primary) hypertension: I10

## 2011-09-12 HISTORY — DX: Bipolar disorder, unspecified: F31.9

## 2011-09-12 HISTORY — DX: Gastro-esophageal reflux disease without esophagitis: K21.9

## 2011-09-12 MED ORDER — ONDANSETRON 4 MG PO TBDP
ORAL_TABLET | ORAL | Status: AC
  Filled 2011-09-12: qty 2

## 2011-09-12 MED ORDER — ONDANSETRON HCL 8 MG PO TABS
8.0000 mg | ORAL_TABLET | Freq: Once | ORAL | Status: AC
  Administered 2011-09-12: 8 mg via ORAL

## 2011-09-12 MED ORDER — ONDANSETRON HCL 4 MG PO TABS: 8.0000 mg | ORAL_TABLET | Freq: Three times a day (TID) | ORAL | Status: AC | PRN

## 2011-09-12 NOTE — ED Provider Notes (Signed)
History     CSN: [id number] Arrival date & time: 09/12/2011  3:05 PM   First MD Initiated Contact with Patient 09/12/11 1509      Chief Complaint  Patient presents with  . Abdominal Cramping  . Diarrhea    (Consider location/radiation/quality/duration/timing/severity/associated sxs/prior treatment) The history is provided by the patient.   is reports development of nausea and diarrhea today.  He denies vomiting.  Denies fever or chills.  He reports occasional abdominal cramping without tenderness.  Patient denies recent sick contacts.  Denies dysuria.  Denies recent antibiotics.  Nothing worsens the symptoms.  Nothing improves the symptoms.  Nothing worsens the symptoms.  Nothing improves the symptoms.  Symptoms are constant.  Past Medical History  Diagnosis Date  . Bipolar 1 disorder   . Hypertension   . Thyroid disease   . GERD (gastroesophageal reflux disease)   . Obstructive sleep apnea     History reviewed. No pertinent past surgical history.  History reviewed. No pertinent family history.  History  Substance Use Topics  . Smoking status: Not on file  . Smokeless tobacco: Not on file  . Alcohol Use: No      Review of Systems  Gastrointestinal: Positive for diarrhea.  All other systems reviewed and are negative.    Allergies  Review of patient's allergies indicates no known allergies.  Home Medications   Current Outpatient Rx  Name Route Sig Dispense Refill  . ACETAMINOPHEN 325 MG PO TABS Oral Take 650 mg by mouth every 6 (six) hours as needed. For pain.     Marland Kitchen AMLODIPINE BESYLATE 10 MG PO TABS Oral Take 10 mg by mouth daily.      . ASPIRIN 325 MG PO TABS Oral Take 325 mg by mouth daily. Take 30 min prior to Niaspan.     Marland Kitchen BENAZEPRIL HCL 20 MG PO TABS Oral Take 20 mg by mouth daily.      Marland Kitchen BENZTROPINE MESYLATE 1 MG PO TABS Oral Take 1 mg by mouth 2 (two) times daily.      Marland Kitchen VITAMIN D3 1000 UNITS PO CAPS Oral Take 1 capsule by mouth daily.      Marland Kitchen  DIPHENHYDRAMINE HCL 25 MG PO TABS Oral Take 25 mg by mouth every 4 (four) hours as needed. For itching, insomnia or allergy.     Marland Kitchen DIVALPROEX SODIUM 500 MG PO TBEC Oral Take 500 mg by mouth 2 (two) times daily.      Marland Kitchen DOCUSATE SODIUM 100 MG PO CAPS Oral Take 100 mg by mouth 2 (two) times daily.      Marland Kitchen FERROUS SULFATE 325 (65 FE) MG PO TABS Oral Take 325 mg by mouth daily.      . GUAIFENESIN 100 MG/5ML PO SYRP Oral Take 200 mg by mouth 4 (four) times daily as needed. For congestion.     Marland Kitchen HYDROCHLOROTHIAZIDE 25 MG PO TABS Oral Take 25 mg by mouth daily.      Marland Kitchen LEVOTHYROXINE SODIUM 75 MCG PO TABS Oral Take 75 mcg by mouth daily.      Marland Kitchen LORAZEPAM 1 MG PO TABS Oral Take 1 mg by mouth every 6 (six) hours as needed. For anxiety.     Marland Kitchen MAGNESIUM HYDROXIDE 400 MG/5ML PO SUSP Oral Take 30 mLs by mouth daily as needed. For constipation.     Carma Leaven M PLUS PO TABS Oral Take 1 tablet by mouth daily.      Marland Kitchen NIACIN (ANTIHYPERLIPIDEMIC) 750 MG PO TBCR Oral  Take 750 mg by mouth daily.      Marland Kitchen OMEPRAZOLE 20 MG PO CPDR Oral Take 20 mg by mouth daily.      Marland Kitchen POTASSIUM CHLORIDE CRYS CR 20 MEQ PO TBCR Oral Take 20 mEq by mouth daily.      . SERTRALINE HCL 100 MG PO TABS Oral Take 150 mg by mouth daily.      Marland Kitchen SIMVASTATIN 20 MG PO TABS Oral Take 20 mg by mouth at bedtime.      . TOBRAMYCIN-DEXAMETHASONE 0.3-0.1 % OP SUSP Both Eyes Place 1 drop into both eyes 4 (four) times daily.      . TRAZODONE HCL 50 MG PO TABS Oral Take 25 mg by mouth at bedtime. For sleep.     Marland Kitchen ZOLPIDEM TARTRATE 10 MG PO TABS Oral Take 10 mg by mouth at bedtime as needed. For sleep.     Marland Kitchen ONDANSETRON HCL 4 MG PO TABS Oral Take 2 tablets (8 mg total) by mouth every 8 (eight) hours as needed for nausea. 15 tablet 0    BP 103/67  Pulse 95  Temp(Src) 98.6 F (37 C) (Oral)  Resp 18  SpO2 97%  Physical Exam  Nursing note and vitals reviewed. Constitutional: He is oriented to person, place, and time. He appears well-developed and  well-nourished.  HENT:  Head: Normocephalic and atraumatic.  Eyes: EOM are normal.  Neck: Normal range of motion.  Cardiovascular: Normal rate, regular rhythm, normal heart sounds and intact distal pulses.   Pulmonary/Chest: Effort normal and breath sounds normal. No respiratory distress.  Abdominal: Soft. He exhibits no distension. There is no tenderness.  Musculoskeletal: Normal range of motion.  Neurological: He is alert and oriented to person, place, and time.  Skin: Skin is warm and dry.  Psychiatric: He has a normal mood and affect. Judgment normal.    ED Course  Procedures (including critical care time)  Labs Reviewed - No data to display No results found.   1. Nausea   2. Diarrhea       MDM  Abdomen is completely benign.  Suspect viral etiology of nausea and diarrhea.  Antibiotics given in the ER.  Hydrated appearing.  Vital signs normal.  Discharge home with instructions for close followup with his primary care physician and/or return to the ER for new or worsening symptoms        Lyanne Co, MD 09/12/11 1527

## 2011-09-12 NOTE — ED Notes (Signed)
Pt states that he had onset of abd pain last night.  Pt was able to eat his entire meal.  Pt went to sleep, woke up with abd pain this morning.  Pt had sweet tea and cake and has cont. To have abd pain.  Pt denies any n/v.

## 2012-02-08 ENCOUNTER — Emergency Department (HOSPITAL_COMMUNITY)
Admission: EM | Admit: 2012-02-08 | Discharge: 2012-02-08 | Disposition: A | Discharge status: Home / Self Care | Payer: Medicare Other | Attending: Emergency Medicine | Admitting: Emergency Medicine

## 2012-02-08 ENCOUNTER — Encounter (HOSPITAL_COMMUNITY): Payer: Self-pay | Admitting: *Deleted

## 2012-02-08 DIAGNOSIS — R197 Diarrhea, unspecified: Secondary | ICD-10-CM

## 2012-02-08 DIAGNOSIS — F319 Bipolar disorder, unspecified: Secondary | ICD-10-CM | POA: Insufficient documentation

## 2012-02-08 DIAGNOSIS — K219 Gastro-esophageal reflux disease without esophagitis: Secondary | ICD-10-CM | POA: Insufficient documentation

## 2012-02-08 DIAGNOSIS — Z79899 Other long term (current) drug therapy: Secondary | ICD-10-CM | POA: Insufficient documentation

## 2012-02-08 DIAGNOSIS — R109 Unspecified abdominal pain: Secondary | ICD-10-CM | POA: Insufficient documentation

## 2012-02-08 DIAGNOSIS — I10 Essential (primary) hypertension: Secondary | ICD-10-CM | POA: Insufficient documentation

## 2012-02-08 LAB — URINALYSIS, ROUTINE W REFLEX MICROSCOPIC
Glucose, UA: NEGATIVE mg/dL
Hgb urine dipstick: NEGATIVE
Protein, ur: NEGATIVE mg/dL

## 2012-02-08 LAB — DIFFERENTIAL
Basophils Relative: 0 % (ref 0–1)
Eosinophils Absolute: 0.9 10*3/uL — ABNORMAL HIGH (ref 0.0–0.7)
Lymphs Abs: 1.3 10*3/uL (ref 0.7–4.0)
Monocytes Absolute: 0.6 10*3/uL (ref 0.1–1.0)
Neutro Abs: 2.9 10*3/uL (ref 1.7–7.7)

## 2012-02-08 LAB — BASIC METABOLIC PANEL
BUN: 15 mg/dL (ref 6–23)
CO2: 26 mEq/L (ref 19–32)
Calcium: 9.2 mg/dL (ref 8.4–10.5)
Creatinine, Ser: 0.96 mg/dL (ref 0.50–1.35)
Glucose, Bld: 110 mg/dL — ABNORMAL HIGH (ref 70–99)
Sodium: 136 mEq/L (ref 135–145)

## 2012-02-08 LAB — CBC
HCT: 33 % — ABNORMAL LOW (ref 39.0–52.0)
Hemoglobin: 10.3 g/dL — ABNORMAL LOW (ref 13.0–17.0)
MCH: 27.5 pg (ref 26.0–34.0)
MCHC: 31.2 g/dL (ref 30.0–36.0)
MCV: 88.2 fL (ref 78.0–100.0)

## 2012-02-08 MED ORDER — SODIUM CHLORIDE 0.9 % IV SOLN
INTRAVENOUS | Status: DC
  Administered 2012-02-08: 15:00:00 via INTRAVENOUS

## 2012-02-08 NOTE — ED Provider Notes (Signed)
History     CSN: [id number]  Arrival date & time 02/08/12  1349   First MD Initiated Contact with Patient 02/08/12 1459      Chief Complaint  Patient presents with  . Abdominal Pain    (Consider location/radiation/quality/duration/timing/severity/associated sxs/prior treatment) HPI Patient presents to the emergency department with generalized abdominal cramping and diarrhea for the past 4 days.  States he has not take any medications for this.  Patient denies chest pain, shortness of breath, weakness, nausea/vomiting, bloody stools, or fevers.  Patient states nothing seems to make the diarrhea worse.  Patient, states he has been seen in the past for similar type symptoms.     Past Medical History  Diagnosis Date  . Bipolar 1 disorder   . Hypertension   . Thyroid disease   . GERD (gastroesophageal reflux disease)   . Obstructive sleep apnea     History reviewed. No pertinent past surgical history.  History reviewed. No pertinent family history.  History  Substance Use Topics  . Smoking status: Not on file  . Smokeless tobacco: Not on file  . Alcohol Use: No      Review of Systems All pertinent positives and negatives reviewed in the history of present illness  Allergies  Review of patient's allergies indicates no known allergies.  Home Medications   Current Outpatient Rx  Name Route Sig Dispense Refill  . ACETAMINOPHEN 325 MG PO TABS Oral Take 650 mg by mouth every 6 (six) hours as needed. For pain.     Marland Kitchen AMLODIPINE BESYLATE 10 MG PO TABS Oral Take 10 mg by mouth daily.      . ASPIRIN 325 MG PO TABS Oral Take 325 mg by mouth daily. Take 30 min prior to Niaspan.     Marland Kitchen BENAZEPRIL HCL 20 MG PO TABS Oral Take 20 mg by mouth daily.      Marland Kitchen BENZTROPINE MESYLATE 1 MG PO TABS Oral Take 1 mg by mouth 2 (two) times daily.      Marland Kitchen VITAMIN D3 1000 UNITS PO CAPS Oral Take 1 capsule by mouth daily.      Marland Kitchen DIPHENHYDRAMINE HCL 25 MG PO TABS Oral Take 25 mg by mouth every 4  (four) hours as needed. For itching, insomnia or allergy.     Marland Kitchen DIVALPROEX SODIUM 500 MG PO TBEC Oral Take 500 mg by mouth 2 (two) times daily.      Marland Kitchen DOCUSATE SODIUM 100 MG PO CAPS Oral Take 100 mg by mouth 2 (two) times daily.      Marland Kitchen FERROUS SULFATE 325 (65 FE) MG PO TABS Oral Take 325 mg by mouth daily.      . GUAIFENESIN 100 MG/5ML PO SYRP Oral Take 200 mg by mouth 4 (four) times daily as needed. For congestion.     Marland Kitchen HALOPERIDOL DECANOATE 50 MG/ML IM SOLN Intramuscular Inject 75 mg into the muscle every 28 (twenty-eight) days.    Marland Kitchen HYDROCHLOROTHIAZIDE 25 MG PO TABS Oral Take 25 mg by mouth daily.      Marland Kitchen LEVOTHYROXINE SODIUM 75 MCG PO TABS Oral Take 75 mcg by mouth daily.      Marland Kitchen LORAZEPAM 1 MG PO TABS Oral Take 1 mg by mouth 3 (three) times daily.    Carma Leaven M PLUS PO TABS Oral Take 1 tablet by mouth daily.      Marland Kitchen NIACIN ER (ANTIHYPERLIPIDEMIC) 750 MG PO TBCR Oral Take 750 mg by mouth daily.      Marland Kitchen OMEPRAZOLE  20 MG PO CPDR Oral Take 20 mg by mouth daily.      Marland Kitchen POTASSIUM CHLORIDE ER 10 MEQ PO TBCR Oral Take 10 mEq by mouth daily.    . QUETIAPINE FUMARATE 300 MG PO TABS Oral Take 150 mg by mouth daily.    . QUETIAPINE FUMARATE 300 MG PO TABS Oral Take 300 mg by mouth at bedtime.    . SERTRALINE HCL 100 MG PO TABS Oral Take 150 mg by mouth daily.      Marland Kitchen SIMVASTATIN 20 MG PO TABS Oral Take 20 mg by mouth at bedtime.      . TOBRAMYCIN-DEXAMETHASONE 0.3-0.1 % OP SUSP Both Eyes Place 1 drop into both eyes 4 (four) times daily.      . TRAZODONE HCL 50 MG PO TABS Oral Take 25 mg by mouth at bedtime. For sleep.     Marland Kitchen ZOLPIDEM TARTRATE 10 MG PO TABS Oral Take 10 mg by mouth at bedtime as needed. For sleep.     Marland Kitchen LORAZEPAM 1 MG PO TABS Oral Take 1 mg by mouth every 6 (six) hours as needed. For agitation    . MAGNESIUM HYDROXIDE 400 MG/5ML PO SUSP Oral Take 30 mLs by mouth daily as needed. For constipation.       BP 127/86  Pulse 111  Temp(Src) 99 F (37.2 C) (Oral)  Resp 20  SpO2  97%  Physical Exam  Constitutional: He is oriented to person, place, and time. He appears well-developed and well-nourished. No distress.  HENT:  Head: Normocephalic and atraumatic.  Mouth/Throat: Uvula is midline, oropharynx is clear and moist and mucous membranes are normal.  Eyes: Pupils are equal, round, and reactive to light.  Neck: Normal range of motion. Neck supple.  Cardiovascular: Normal rate, regular rhythm and normal heart sounds.  Exam reveals no gallop and no friction rub.   No murmur heard. Pulmonary/Chest: Effort normal and breath sounds normal. No respiratory distress. He has no wheezes. He has no rales.  Abdominal: Soft. Bowel sounds are normal. He exhibits no distension. There is no tenderness. There is no rebound and no guarding.  Neurological: He is alert and oriented to person, place, and time.  Skin: Skin is warm. No rash noted.    ED Course  Procedures (including critical care time)  Labs Reviewed  CBC - Abnormal; Notable for the following:    RBC 3.74 (*)    Hemoglobin 10.3 (*)    HCT 33.0 (*)    All other components within normal limits  DIFFERENTIAL - Abnormal; Notable for the following:    Eosinophils Relative 15 (*)    Eosinophils Absolute 0.9 (*)    All other components within normal limits  BASIC METABOLIC PANEL - Abnormal; Notable for the following:    Glucose, Bld 110 (*)    All other components within normal limits  URINALYSIS, ROUTINE W REFLEX MICROSCOPIC   The patient received a liter of fluids and states he is feeling better like to go home.  He has been eating food here in the emergency department.  Patient is asked to return here as needed for any worsening in his condition.  MDM  MDM Reviewed: previous chart, vitals and nursing note Interpretation: labs            Carlyle Dolly, PA-C 02/08/12 1756

## 2012-02-08 NOTE — ED Notes (Signed)
Per ems: pt c/o right lower abdominal pain radiates to back. Increased in urination. Pt states pain started on Friday. Pt is also c/o shoulder pain. Pt is from [facility]

## 2012-02-08 NOTE — ED Provider Notes (Signed)
On end of bed eating in no distress. He states he had been having diarrhea for the past couple days but he hasn't had diarrhea today. Patient is alert and cooperative he is in no distress. He states he's ready to go home.  Medical screening examination/treatment/procedure(s) were conducted as a shared visit with non-physician practitioner(s) and myself.  I personally evaluated the patient during the encounter Devoria Albe, MD, Franz Dell, MD 02/08/12 [phone]

## 2012-02-08 NOTE — Discharge Instructions (Signed)
Return here as needed for any worsening in her condition. You can use Imodium for diarrhea.

## 2012-02-08 NOTE — ED Notes (Signed)
Pt is falling asleep while answering questions

## 2012-02-09 NOTE — ED Provider Notes (Signed)
See prior note   Aviella Disbrow L Amarachi Kotz, MD 02/09/12 0113 

## 2012-10-29 ENCOUNTER — Observation Stay (HOSPITAL_COMMUNITY)
Admission: EM | Admit: 2012-10-29 | Discharge: 2012-11-01 | Payer: Medicare Other | Attending: Internal Medicine | Admitting: Internal Medicine

## 2012-10-29 ENCOUNTER — Encounter (HOSPITAL_COMMUNITY): Payer: Self-pay | Admitting: *Deleted

## 2012-10-29 DIAGNOSIS — R059 Cough, unspecified: Secondary | ICD-10-CM | POA: Insufficient documentation

## 2012-10-29 DIAGNOSIS — K5900 Constipation, unspecified: Secondary | ICD-10-CM | POA: Insufficient documentation

## 2012-10-29 DIAGNOSIS — J441 Chronic obstructive pulmonary disease with (acute) exacerbation: Principal | ICD-10-CM | POA: Insufficient documentation

## 2012-10-29 DIAGNOSIS — K219 Gastro-esophageal reflux disease without esophagitis: Secondary | ICD-10-CM | POA: Insufficient documentation

## 2012-10-29 DIAGNOSIS — R05 Cough: Secondary | ICD-10-CM | POA: Insufficient documentation

## 2012-10-29 DIAGNOSIS — I10 Essential (primary) hypertension: Secondary | ICD-10-CM | POA: Insufficient documentation

## 2012-10-29 DIAGNOSIS — G4733 Obstructive sleep apnea (adult) (pediatric): Secondary | ICD-10-CM | POA: Diagnosis present

## 2012-10-29 DIAGNOSIS — R0602 Shortness of breath: Secondary | ICD-10-CM | POA: Insufficient documentation

## 2012-10-29 DIAGNOSIS — R0902 Hypoxemia: Secondary | ICD-10-CM | POA: Insufficient documentation

## 2012-10-29 DIAGNOSIS — F319 Bipolar disorder, unspecified: Secondary | ICD-10-CM | POA: Insufficient documentation

## 2012-10-29 DIAGNOSIS — Z23 Encounter for immunization: Secondary | ICD-10-CM | POA: Insufficient documentation

## 2012-10-29 DIAGNOSIS — R1032 Left lower quadrant pain: Secondary | ICD-10-CM | POA: Insufficient documentation

## 2012-10-29 MED ORDER — IPRATROPIUM BROMIDE 0.02 % IN SOLN
0.5000 mg | Freq: Once | RESPIRATORY_TRACT | Status: AC
  Administered 2012-10-30: 0.5 mg via RESPIRATORY_TRACT
  Filled 2012-10-29: qty 2.5

## 2012-10-29 MED ORDER — ALBUTEROL SULFATE (5 MG/ML) 0.5% IN NEBU
5.0000 mg | INHALATION_SOLUTION | Freq: Once | RESPIRATORY_TRACT | Status: AC
  Administered 2012-10-30: 5 mg via RESPIRATORY_TRACT
  Filled 2012-10-29: qty 1

## 2012-10-29 MED ORDER — PREDNISONE 20 MG PO TABS
60.0000 mg | ORAL_TABLET | Freq: Once | ORAL | Status: AC
  Administered 2012-10-30: 60 mg via ORAL
  Filled 2012-10-29: qty 3

## 2012-10-29 MED ORDER — KETOROLAC TROMETHAMINE 30 MG/ML IJ SOLN
30.0000 mg | Freq: Once | INTRAMUSCULAR | Status: AC
  Administered 2012-10-30: 30 mg via INTRAVENOUS
  Filled 2012-10-29: qty 1

## 2012-10-29 MED ORDER — SODIUM CHLORIDE 0.9 % IV BOLUS (SEPSIS)
1000.0000 mL | Freq: Once | INTRAVENOUS | Status: AC
  Administered 2012-10-30: 1000 mL via INTRAVENOUS

## 2012-10-29 NOTE — ED Notes (Signed)
Per EMS: pt coming from [facility]. Pt c/o abdominal pain for a week only associated with his cough. Pt denies n/v/d. Pt reports a fever for a week and wants to be checked out. Pt is A&Ox4, skin warm and dry, respirations equal and unlabored, pt presents with minor slurred speech which he states is his baseline speech pattern.

## 2012-10-29 NOTE — ED Provider Notes (Signed)
History  This chart was scribed for Glynn Octave, MD by Bennett Scrape, ED Scribe. This patient was seen in room A02C/A02C and the patient's care was started at 11:31 PM.  CSN: [id number]  Arrival date & time 10/29/12  2326   First MD Initiated Contact with Patient 10/29/12 2331      Chief Complaint  Patient presents with  . Abdominal Pain    The history is provided by the patient. No language interpreter was used.   [name] is a 52 y.o. male brought in by ambulance from [facility], who presents to the Emergency Department complaining of one week of gradual onset, gradually worsening, intermittent LLQ abdominal pain with associated subjective fevers, mild cough and intermittent SOB. He states that the abdominal pain is only worse with coughing. He reports that his girlfriend has termites and his breathing became worse when he came in contact with the wood dust recently, but  he denies SOB currently. Pt has slow, slurred speech but reports that is his baseline. He reports that he has been eating and drinking normally. He has a h/o COPD and reports prior admissions for COPD exacerbation, last admission was one year ago per pt. He denies emesis, constipation, diarrhea, hematochezia, HA and dizziness as associated symptoms. He also has a h/o bipolar disorder and HTN and denies alcohol use.  PCP is Dr. Clelia Croft.   Past Medical History  Diagnosis Date  . Bipolar 1 disorder   . Hypertension   . Thyroid disease   . GERD (gastroesophageal reflux disease)   . Obstructive sleep apnea     History reviewed. No pertinent past surgical history.  Family History  Problem Relation Age of Onset  . Cirrhosis Mother   . Hypertension Mother   . Diabetes Mellitus II Mother     History  Substance Use Topics  . Smoking status: Current Every Day Smoker  . Smokeless tobacco: Not on file  . Alcohol Use: Yes     Comment: about 2 beers every day.       Review of Systems  A  complete 10 system review of systems was obtained and all systems are negative except as noted in the HPI and PMH.   Allergies  Review of patient's allergies indicates no known allergies.  Home Medications   Current Outpatient Rx  Name  Route  Sig  Dispense  Refill  . ACETAMINOPHEN 325 MG PO TABS   Oral   Take 650 mg by mouth every 6 (six) hours as needed. For pain.          Marland Kitchen AMLODIPINE BESYLATE 10 MG PO TABS   Oral   Take 10 mg by mouth daily.           . ASPIRIN 325 MG PO TABS   Oral   Take 325 mg by mouth daily. Take 30 min prior to Niaspan.          Marland Kitchen BENAZEPRIL HCL 20 MG PO TABS   Oral   Take 20 mg by mouth daily.           Marland Kitchen BENZTROPINE MESYLATE 1 MG PO TABS   Oral   Take 1 mg by mouth 2 (two) times daily.           Marland Kitchen VITAMIN D3 1000 UNITS PO CAPS   Oral   Take 1 capsule by mouth daily.           Marland Kitchen DIMENHYDRINATE 50 MG PO TABS  Oral   Take 50 mg by mouth every 6 (six) hours as needed. For nausea/vomiting         . DIVALPROEX SODIUM 500 MG PO TBEC   Oral   Take 500 mg by mouth 2 (two) times daily.           Marland Kitchen DOCUSATE SODIUM 100 MG PO CAPS   Oral   Take 100 mg by mouth 2 (two) times daily.           Marland Kitchen FERROUS SULFATE 325 (65 FE) MG PO TABS   Oral   Take 325 mg by mouth daily.           . GUAIFENESIN 100 MG/5ML PO SYRP   Oral   Take 200 mg by mouth 4 (four) times daily as needed. For congestion.          Marland Kitchen HALOPERIDOL DECANOATE 50 MG/ML IM SOLN   Intramuscular   Inject 75 mg into the muscle every 28 (twenty-eight) days.         Marland Kitchen HYDROCHLOROTHIAZIDE 25 MG PO TABS   Oral   Take 25 mg by mouth daily.           Marland Kitchen LEVOTHYROXINE SODIUM 75 MCG PO TABS   Oral   Take 75 mcg by mouth daily.           Marland Kitchen LOPERAMIDE HCL 2 MG PO CAPS   Oral   Take 2 mg by mouth 4 (four) times daily as needed. For loose stool         . LORAZEPAM 1 MG PO TABS   Oral   Take 1 mg by mouth every 6 (six) hours as needed. For agitation           . LORAZEPAM 1 MG PO TABS   Oral   Take 1 mg by mouth 4 (four) times daily.          . MELOXICAM 7.5 MG PO TABS   Oral   Take 7.5 mg by mouth daily.         Carma Leaven M PLUS PO TABS   Oral   Take 1 tablet by mouth daily.           Marland Kitchen NIACIN ER (ANTIHYPERLIPIDEMIC) 750 MG PO TBCR   Oral   Take 750 mg by mouth daily.           Marland Kitchen OMEPRAZOLE 20 MG PO CPDR   Oral   Take 20 mg by mouth daily.           Marland Kitchen POTASSIUM CHLORIDE ER 10 MEQ PO TBCR   Oral   Take 10 mEq by mouth daily.         . QUETIAPINE FUMARATE 200 MG PO TABS   Oral   Take 200 mg by mouth daily.         . QUETIAPINE FUMARATE 300 MG PO TABS   Oral   Take 400 mg by mouth at bedtime.          . SERTRALINE HCL 100 MG PO TABS   Oral   Take 150 mg by mouth daily.           Marland Kitchen SIMVASTATIN 20 MG PO TABS   Oral   Take 20 mg by mouth at bedtime.           . TOBRAMYCIN-DEXAMETHASONE 0.3-0.1 % OP SUSP   Both Eyes   Place 1 drop into both eyes 4 (four) times daily.           Marland Kitchen  TRAZODONE HCL 50 MG PO TABS   Oral   Take 25 mg by mouth at bedtime. For sleep.          Marland Kitchen ZOLPIDEM TARTRATE 10 MG PO TABS   Oral   Take 5 mg by mouth at bedtime as needed. For sleep.         Marland Kitchen DIPHENHYDRAMINE HCL 25 MG PO TABS   Oral   Take 25 mg by mouth every 4 (four) hours as needed. For itching, insomnia or allergy.          Marland Kitchen MAGNESIUM HYDROXIDE 400 MG/5ML PO SUSP   Oral   Take 30 mLs by mouth daily as needed. For constipation.            Triage Vitals: BP 171/104  Pulse 105  Temp 98.4 F (36.9 C) (Oral)  Resp 18  SpO2 97%  Physical Exam  Nursing note and vitals reviewed. Constitutional: He is oriented to person, place, and time. He appears well-developed and well-nourished. No distress.  HENT:  Head: Normocephalic and atraumatic.  Mouth/Throat: Oropharynx is clear and moist.  Eyes: Conjunctivae normal and EOM are normal. Pupils are equal, round, and reactive to light.  Neck: Normal range of  motion. Neck supple. No tracheal deviation present.       No meningismus   Cardiovascular: Regular rhythm.  Tachycardia present.   Pulmonary/Chest: Effort normal. No respiratory distress.       Coarse breath sounds with bibasilar rhonchi, mild tachypnea   Abdominal: Soft. Bowel sounds are normal. There is no tenderness. There is no rebound and no guarding.  Musculoskeletal: Normal range of motion. He exhibits no edema.  Neurological: He is alert and oriented to person, place, and time. No cranial nerve deficit (Cranial nerves 2 through 12 are intact).       Strength is 5/5 throughout, no ataxia on finger to nose, slurred speech which pt reports is baseline  Skin: Skin is warm and dry.  Psychiatric: He has a normal mood and affect. His behavior is normal.    ED Course  Procedures (including critical care time)  DIAGNOSTIC STUDIES: Oxygen Saturation is 97% on room air, adequate by my interpretation.    COORDINATION OF CARE: 11:36 PM- Discussed treatment plan which includes CXR, CBC panel, UA and a breathing treatment with pt at bedside and pt agreed to plan.   11:45 PM- Ordered 1,000 mL of bolus, 60 mg prednisone tablet, 5 mg of 0.5% albuterol nebulizer solution and 0.5 mg of Atrovent solution.  12:00 AM- Ordered 30 mg Toradol injection  1:50 AM-Pt rechecked and feels improved. Upon re-exam, pt has snoring and O2 levels drop to 60% while sleeping.  2:00 AM- Oxygen reapplied. When asked, pt states that he felt SOB while ambulating. Discussed admission for overnight observation which pt agreed to.  2:30 AM-Consult complete with Dr. Erlinda Hong. Patient case explained and discussed. She will come evaluate the pt in the ED and also requested a d-dimer be ordered. Call ended at 2:31 AM.  Labs Reviewed  CBC WITH DIFFERENTIAL - Abnormal; Notable for the following:    WBC 3.9 (*)     RBC 3.86 (*)     Hemoglobin 10.8 (*)     HCT 33.3 (*)     Monocytes Relative 24 (*)     All other components  within normal limits  COMPREHENSIVE METABOLIC PANEL - Abnormal; Notable for the following:    Sodium 134 (*)     Chloride 92 (*)  Glucose, Bld 119 (*)     Total Bilirubin 0.1 (*)     GFR calc non Af Amer 75 (*)     GFR calc Af Amer 87 (*)     All other components within normal limits  TROPONIN I  LIPASE, BLOOD  URINALYSIS, ROUTINE W REFLEX MICROSCOPIC  RAPID STREP SCREEN  D-DIMER, QUANTITATIVE   Dg Abd Acute W/chest  10/30/2012  *RADIOLOGY REPORT*  Clinical Data: Cough for 1 month.  Abdominal pain with coughing.  ACUTE ABDOMEN SERIES (ABDOMEN 2 VIEW & CHEST 1 VIEW)  Comparison: PA and lateral chest 08/01/2008.  Plain film of the abdomen 07/12/2008.  Findings: Single view of the chest demonstrates clear lungs and normal heart size.  No pneumothorax or pleural fluid.  Two views of the abdomen show no free intraperitoneal air.  Large stool burden noted.  The patient has degenerative disease about the hips, worse on the right.  IMPRESSION: No acute finding.  Large stool burden throughout the colon.   Original Report Authenticated By: Holley Dexter, M.D.      1. COPD exacerbation   2. Hypoxia   3. Bipolar 1 disorder   4. Constipation   5. HTN (hypertension)       MDM  One week of intermittent abdominal pain worse with coughing. No nausea, vomiting or diarrhea. Subjective fevers at home but no measured temperature. Slurred speech at baseline. Abdomen soft and nonsurgical.   mild tachypnea with increased work of breathing and bibasilar rhonchi.  Chest x-ray negative. Large stool burden and abdomen. CBC and chemistry unremarkable. Patient given nebulizer and steroids with improvement in breathing.  Patient states his breathing is okay but he is tachypneic on room air and remained labored. He desaturates to 85% with ambulation.  His hypoxia likely secondary to sleep apnea. D-dimer is negative. Chest x-ray does not show any infiltrate.     Date: 10/29/2012  Rate: 105  Rhythm:  sinus tachycardia  QRS Axis: normal  Intervals: normal  ST/T Wave abnormalities: normal  Conduction Disutrbances:none  Narrative Interpretation:   Old EKG Reviewed: unchanged    I personally performed the services described in this documentation, which was scribed in my presence. The recorded information has been reviewed and is accurate.       Glynn Octave, MD 10/30/12 [phone]

## 2012-10-30 ENCOUNTER — Emergency Department (HOSPITAL_COMMUNITY): Payer: Medicare Other

## 2012-10-30 ENCOUNTER — Encounter (HOSPITAL_COMMUNITY): Payer: Self-pay | Admitting: Internal Medicine

## 2012-10-30 DIAGNOSIS — R0902 Hypoxemia: Secondary | ICD-10-CM

## 2012-10-30 DIAGNOSIS — K5900 Constipation, unspecified: Secondary | ICD-10-CM | POA: Diagnosis present

## 2012-10-30 DIAGNOSIS — G4733 Obstructive sleep apnea (adult) (pediatric): Secondary | ICD-10-CM | POA: Diagnosis present

## 2012-10-30 DIAGNOSIS — R0602 Shortness of breath: Secondary | ICD-10-CM | POA: Diagnosis present

## 2012-10-30 DIAGNOSIS — I10 Essential (primary) hypertension: Secondary | ICD-10-CM | POA: Diagnosis present

## 2012-10-30 DIAGNOSIS — F319 Bipolar disorder, unspecified: Secondary | ICD-10-CM | POA: Diagnosis present

## 2012-10-30 LAB — CBC WITH DIFFERENTIAL/PLATELET
Basophils Absolute: 0 10*3/uL (ref 0.0–0.1)
Eosinophils Relative: 3 % (ref 0–5)
HCT: 33.3 % — ABNORMAL LOW (ref 39.0–52.0)
Lymphocytes Relative: 20 % (ref 12–46)
Lymphs Abs: 0.8 10*3/uL (ref 0.7–4.0)
MCV: 86.3 fL (ref 78.0–100.0)
Monocytes Absolute: 0.9 10*3/uL (ref 0.1–1.0)
RDW: 15.1 % (ref 11.5–15.5)
WBC: 3.9 10*3/uL — ABNORMAL LOW (ref 4.0–10.5)

## 2012-10-30 LAB — BLOOD GAS, ARTERIAL
Acid-Base Excess: 2.6 mmol/L — ABNORMAL HIGH (ref 0.0–2.0)
Drawn by: 15527
TCO2: 29.5 mmol/L (ref 0–100)
pCO2 arterial: 53 mmHg — ABNORMAL HIGH (ref 35.0–45.0)
pO2, Arterial: 79 mmHg — ABNORMAL LOW (ref 80.0–100.0)

## 2012-10-30 LAB — COMPREHENSIVE METABOLIC PANEL
AST: 23 U/L (ref 0–37)
Albumin: 3.6 g/dL (ref 3.5–5.2)
Alkaline Phosphatase: 79 U/L (ref 39–117)
CO2: 29 mEq/L (ref 19–32)
Calcium: 9.3 mg/dL (ref 8.4–10.5)
Chloride: 100 mEq/L (ref 96–112)
Creatinine, Ser: 1.11 mg/dL (ref 0.50–1.35)
GFR calc Af Amer: 87 mL/min — ABNORMAL LOW (ref >90)
GFR calc non Af Amer: 75 mL/min — ABNORMAL LOW (ref >90)
Glucose, Bld: 119 mg/dL — ABNORMAL HIGH (ref 70–99)
Potassium: 4.5 mEq/L (ref 3.5–5.1)
Total Bilirubin: 0.1 mg/dL — ABNORMAL LOW (ref 0.3–1.2)

## 2012-10-30 LAB — URINALYSIS, ROUTINE W REFLEX MICROSCOPIC
Ketones, ur: NEGATIVE mg/dL
Leukocytes, UA: NEGATIVE
Nitrite: NEGATIVE
Protein, ur: NEGATIVE mg/dL

## 2012-10-30 LAB — CBC
MCH: 27.9 pg (ref 26.0–34.0)
MCHC: 31.7 g/dL (ref 30.0–36.0)
MCV: 87.8 fL (ref 78.0–100.0)
Platelets: 244 10*3/uL (ref 150–400)
RDW: 15.4 % (ref 11.5–15.5)

## 2012-10-30 LAB — LIPASE, BLOOD: Lipase: 32 U/L (ref 11–59)

## 2012-10-30 LAB — TSH: TSH: 1.55 u[IU]/mL (ref 0.350–4.500)

## 2012-10-30 LAB — MRSA PCR SCREENING: MRSA by PCR: NEGATIVE

## 2012-10-30 LAB — TROPONIN I: Troponin I: <0.3 ng/mL (ref <0.30)

## 2012-10-30 LAB — AMMONIA: Ammonia: 37 umol/L (ref 11–60)

## 2012-10-30 MED ORDER — BISACODYL 10 MG RE SUPP
10.0000 mg | Freq: Every day | RECTAL | Status: DC
  Filled 2012-10-30: qty 1

## 2012-10-30 MED ORDER — DIVALPROEX SODIUM 500 MG PO DR TAB
500.0000 mg | DELAYED_RELEASE_TABLET | Freq: Two times a day (BID) | ORAL | Status: DC
  Administered 2012-10-30 – 2012-11-01 (×5): 500 mg via ORAL
  Filled 2012-10-30 (×6): qty 1

## 2012-10-30 MED ORDER — ONDANSETRON HCL 4 MG PO TABS: 4.0000 mg | ORAL_TABLET | Freq: Four times a day (QID) | ORAL | Status: DC | PRN

## 2012-10-30 MED ORDER — INFLUENZA VIRUS VACC SPLIT PF IM SUSP
0.5000 mL | INTRAMUSCULAR | Status: AC
  Administered 2012-10-31: 0.5 mL via INTRAMUSCULAR
  Filled 2012-10-30: qty 0.5

## 2012-10-30 MED ORDER — QUETIAPINE FUMARATE 200 MG PO TABS
200.0000 mg | ORAL_TABLET | Freq: Every day | ORAL | Status: DC
  Filled 2012-10-30: qty 1

## 2012-10-30 MED ORDER — HYDROCODONE-ACETAMINOPHEN 5-325 MG PO TABS: 1.0000 | ORAL_TABLET | ORAL | Status: DC | PRN

## 2012-10-30 MED ORDER — PNEUMOCOCCAL VAC POLYVALENT 25 MCG/0.5ML IJ INJ
0.5000 mL | INJECTION | INTRAMUSCULAR | Status: AC
  Administered 2012-10-31: 0.5 mL via INTRAMUSCULAR
  Filled 2012-10-30: qty 0.5

## 2012-10-30 MED ORDER — IPRATROPIUM BROMIDE 0.02 % IN SOLN
0.5000 mg | Freq: Once | RESPIRATORY_TRACT | Status: AC
  Administered 2012-10-30: 0.5 mg via RESPIRATORY_TRACT
  Filled 2012-10-30: qty 2.5

## 2012-10-30 MED ORDER — ALBUTEROL SULFATE (5 MG/ML) 0.5% IN NEBU: 2.5000 mg | INHALATION_SOLUTION | RESPIRATORY_TRACT | Status: DC | PRN

## 2012-10-30 MED ORDER — ONDANSETRON HCL 4 MG/2ML IJ SOLN: 4.0000 mg | Freq: Four times a day (QID) | INTRAMUSCULAR | Status: DC | PRN

## 2012-10-30 MED ORDER — TRAZODONE 25 MG HALF TABLET
25.0000 mg | ORAL_TABLET | Freq: Every day | ORAL | Status: DC
  Filled 2012-10-30: qty 1

## 2012-10-30 MED ORDER — POTASSIUM CHLORIDE ER 10 MEQ PO TBCR
10.0000 meq | EXTENDED_RELEASE_TABLET | Freq: Every day | ORAL | Status: DC
  Administered 2012-10-30 – 2012-11-01 (×3): 10 meq via ORAL
  Filled 2012-10-30 (×3): qty 1

## 2012-10-30 MED ORDER — DOCUSATE SODIUM 100 MG PO CAPS: 100.0000 mg | ORAL_CAPSULE | Freq: Two times a day (BID) | ORAL | Status: DC

## 2012-10-30 MED ORDER — POLYETHYLENE GLYCOL 3350 17 G PO PACK: 17.0000 g | PACK | Freq: Every day | ORAL | Status: DC | PRN

## 2012-10-30 MED ORDER — QUETIAPINE FUMARATE 200 MG PO TABS
200.0000 mg | ORAL_TABLET | Freq: Two times a day (BID) | ORAL | Status: DC
  Administered 2012-10-30 – 2012-11-01 (×5): 200 mg via ORAL
  Filled 2012-10-30 (×6): qty 1

## 2012-10-30 MED ORDER — SERTRALINE HCL 50 MG PO TABS
150.0000 mg | ORAL_TABLET | Freq: Every day | ORAL | Status: DC
  Administered 2012-10-30 – 2012-11-01 (×3): 150 mg via ORAL
  Filled 2012-10-30 (×3): qty 1

## 2012-10-30 MED ORDER — SIMVASTATIN 20 MG PO TABS
20.0000 mg | ORAL_TABLET | Freq: Every day | ORAL | Status: DC
  Administered 2012-10-30 – 2012-10-31 (×2): 20 mg via ORAL
  Filled 2012-10-30 (×3): qty 1

## 2012-10-30 MED ORDER — DOCUSATE SODIUM 100 MG PO CAPS
200.0000 mg | ORAL_CAPSULE | Freq: Two times a day (BID) | ORAL | Status: DC
  Administered 2012-10-30 – 2012-11-01 (×5): 200 mg via ORAL
  Filled 2012-10-30 (×6): qty 2

## 2012-10-30 MED ORDER — MAGNESIUM CITRATE PO SOLN: 1.0000 | Freq: Once | ORAL | Status: AC | PRN

## 2012-10-30 MED ORDER — QUETIAPINE FUMARATE 400 MG PO TABS
400.0000 mg | ORAL_TABLET | Freq: Every day | ORAL | Status: DC
Start: 2012-10-30 — End: 2012-10-30
  Filled 2012-10-30: qty 1

## 2012-10-30 MED ORDER — BENZTROPINE MESYLATE 1 MG PO TABS
1.0000 mg | ORAL_TABLET | Freq: Two times a day (BID) | ORAL | Status: DC
  Administered 2012-10-30 – 2012-11-01 (×5): 1 mg via ORAL
  Filled 2012-10-30 (×7): qty 1

## 2012-10-30 MED ORDER — SODIUM CHLORIDE 0.9 % IJ SOLN: 3.0000 mL | INTRAMUSCULAR | Status: DC | PRN

## 2012-10-30 MED ORDER — ASPIRIN 325 MG PO TABS
325.0000 mg | ORAL_TABLET | Freq: Every day | ORAL | Status: DC
  Administered 2012-10-30 – 2012-11-01 (×3): 325 mg via ORAL
  Filled 2012-10-30 (×3): qty 1

## 2012-10-30 MED ORDER — SENNA 8.6 MG PO TABS
1.0000 | ORAL_TABLET | Freq: Two times a day (BID) | ORAL | Status: DC
  Administered 2012-10-30 – 2012-11-01 (×5): 8.6 mg via ORAL
  Filled 2012-10-30 (×6): qty 1

## 2012-10-30 MED ORDER — BENAZEPRIL HCL 20 MG PO TABS
20.0000 mg | ORAL_TABLET | Freq: Every day | ORAL | Status: DC
  Administered 2012-10-30 – 2012-11-01 (×3): 20 mg via ORAL
  Filled 2012-10-30 (×3): qty 1

## 2012-10-30 MED ORDER — AMLODIPINE BESYLATE 10 MG PO TABS
10.0000 mg | ORAL_TABLET | Freq: Every day | ORAL | Status: DC
  Administered 2012-10-30 – 2012-11-01 (×3): 10 mg via ORAL
  Filled 2012-10-30 (×3): qty 1

## 2012-10-30 MED ORDER — ACETAMINOPHEN 650 MG RE SUPP: 650.0000 mg | Freq: Four times a day (QID) | RECTAL | Status: DC | PRN

## 2012-10-30 MED ORDER — LEVOTHYROXINE SODIUM 75 MCG PO TABS
75.0000 ug | ORAL_TABLET | Freq: Every day | ORAL | Status: DC
  Administered 2012-10-30 – 2012-11-01 (×3): 75 ug via ORAL
  Filled 2012-10-30 (×5): qty 1

## 2012-10-30 MED ORDER — SODIUM CHLORIDE 0.9 % IJ SOLN: 3.0000 mL | Freq: Two times a day (BID) | INTRAMUSCULAR | Status: DC

## 2012-10-30 MED ORDER — GUAIFENESIN 100 MG/5ML PO SOLN
200.0000 mg | Freq: Four times a day (QID) | ORAL | Status: DC | PRN
  Filled 2012-10-30: qty 10

## 2012-10-30 MED ORDER — ACETAMINOPHEN 325 MG PO TABS
650.0000 mg | ORAL_TABLET | Freq: Four times a day (QID) | ORAL | Status: DC | PRN
  Administered 2012-10-31 – 2012-11-01 (×3): 650 mg via ORAL
  Filled 2012-10-30 (×3): qty 2

## 2012-10-30 MED ORDER — NIACIN ER (ANTIHYPERLIPIDEMIC) 750 MG PO TBCR: 750.0000 mg | EXTENDED_RELEASE_TABLET | Freq: Every day | ORAL | Status: DC

## 2012-10-30 MED ORDER — NIACIN ER 500 MG PO CPCR
750.0000 mg | ORAL_CAPSULE | Freq: Every day | ORAL | Status: DC
  Administered 2012-10-30: 750 mg via ORAL
  Filled 2012-10-30 (×2): qty 1

## 2012-10-30 MED ORDER — PANTOPRAZOLE SODIUM 40 MG PO TBEC
40.0000 mg | DELAYED_RELEASE_TABLET | Freq: Every day | ORAL | Status: DC
  Administered 2012-10-30 – 2012-11-01 (×3): 40 mg via ORAL
  Filled 2012-10-30 (×2): qty 1

## 2012-10-30 MED ORDER — ENOXAPARIN SODIUM 40 MG/0.4ML ~~LOC~~ SOLN
40.0000 mg | SUBCUTANEOUS | Status: DC
  Administered 2012-10-30: 40 mg via SUBCUTANEOUS
  Filled 2012-10-30 (×3): qty 0.4

## 2012-10-30 MED ORDER — TOBRAMYCIN-DEXAMETHASONE 0.3-0.1 % OP SUSP
1.0000 [drp] | Freq: Four times a day (QID) | OPHTHALMIC | Status: DC
  Administered 2012-10-30 – 2012-11-01 (×7): 1 [drp] via OPHTHALMIC
  Filled 2012-10-30: qty 2.5

## 2012-10-30 MED ORDER — FERROUS SULFATE 325 (65 FE) MG PO TABS
325.0000 mg | ORAL_TABLET | Freq: Every day | ORAL | Status: DC
  Administered 2012-10-30 – 2012-11-01 (×3): 325 mg via ORAL
  Filled 2012-10-30 (×3): qty 1

## 2012-10-30 MED ORDER — ALBUTEROL SULFATE (5 MG/ML) 0.5% IN NEBU
5.0000 mg | INHALATION_SOLUTION | Freq: Once | RESPIRATORY_TRACT | Status: AC
  Administered 2012-10-30: 5 mg via RESPIRATORY_TRACT
  Filled 2012-10-30: qty 1

## 2012-10-30 MED ORDER — LORAZEPAM 0.5 MG PO TABS
0.5000 mg | ORAL_TABLET | Freq: Two times a day (BID) | ORAL | Status: DC
  Administered 2012-10-30 – 2012-10-31 (×4): 0.5 mg via ORAL
  Filled 2012-10-30 (×4): qty 1

## 2012-10-30 MED ORDER — SODIUM CHLORIDE 0.9 % IJ SOLN
3.0000 mL | Freq: Two times a day (BID) | INTRAMUSCULAR | Status: DC
  Administered 2012-10-30 (×2): 3 mL via INTRAVENOUS

## 2012-10-30 MED ORDER — SODIUM CHLORIDE 0.9 % IV BOLUS (SEPSIS): 1000.0000 mL | Freq: Once | INTRAVENOUS | Status: DC

## 2012-10-30 MED ORDER — GUAIFENESIN 100 MG/5ML PO SYRP
200.0000 mg | ORAL_SOLUTION | Freq: Four times a day (QID) | ORAL | Status: DC | PRN
  Filled 2012-10-30: qty 118

## 2012-10-30 MED ORDER — SORBITOL 70 % SOLN: 30.0000 mL | Freq: Every day | Status: DC | PRN

## 2012-10-30 MED ORDER — SODIUM CHLORIDE 0.9 % IV SOLN: 250.0000 mL | INTRAVENOUS | Status: DC | PRN

## 2012-10-30 MED ORDER — LORAZEPAM 1 MG PO TABS: 1.0000 mg | ORAL_TABLET | Freq: Four times a day (QID) | ORAL | Status: DC

## 2012-10-30 NOTE — Progress Notes (Signed)
Pt placed on nasal CPAP mask at 14cm H2O per what he wears at home. Pt tolerating well and states that he is comfortable. RT will continue to monitor and obtain follow up ABG at in 6 hours on CPAP (per MD order)

## 2012-10-30 NOTE — Progress Notes (Signed)
Clinical Social Work Department BRIEF PSYCHOSOCIAL ASSESSMENT 10/30/2012  Patient:  [name],[name]     Account Number:  [id number]     Admit date:  10/29/2012  Clinical Social Worker:  Dennison Bulla  Date/Time:  10/30/2012 01:00 PM  Referred by:  Physician  Date Referred:  10/30/2012 Referred for  ALF Placement   Other Referral:   Interview type:  Patient Other interview type:    PSYCHOSOCIAL DATA Living Status:  FACILITY Admitted from facility:  [facility] Level of care:  Assisted Living Primary support name:  Cora Primary support relationship to patient:  SIBLING Degree of support available:   Adequate    CURRENT CONCERNS Current Concerns  Post-Acute Placement   Other Concerns:    SOCIAL WORK ASSESSMENT / PLAN CSW received referral due to patient being admitted from ALF. CSW reviewed chart and met with patient at bedside. No visitors present.    CSW introduced myself and explained role. Patient reports he has been at [facility] for about 8 years. Patient wants to return at dc. CSW explained dc process and CSW role. CSW called ALF who is agreeable to accept patient at dc. ALF reports patient has cpap PTA and can use cpap at facility.    CSW completed FL2 and placed on chart for MD signature.   Assessment/plan status:  Psychosocial Support/Ongoing Assessment of Needs Other assessment/ plan:   Information/referral to community resources:   Will return to ALF    PATIENT'S/FAMILY'S RESPONSE TO PLAN OF CARE: Patient alert and oriented. Patient engaged in assessment. Patient reports siblings are aware of hospitalization and reports CSW does not need to contact any family.

## 2012-10-30 NOTE — ED Notes (Signed)
Pt given meal and drink. Pt denies any other needs at this time.

## 2012-10-30 NOTE — ED Notes (Signed)
Pt 85% on RA upon ambulating. Pt's O2 saturations fell into 60's upon falling asleep. Pt states he has CPAP mask at home for sleeping but typically does not use it.

## 2012-10-30 NOTE — Progress Notes (Signed)
Pt was transported from the ed to 2038, pt was made comfortable in bed, placed on tele.W ill continue to monitor pt--Arrian Manson D. RN

## 2012-10-30 NOTE — H&P (Signed)
PCP:  He is unsure   Chief Complaint:   Shortness of breath and wanted to check on his Sleep apnea machine  HPI: [name] is a 52 y.o. male   has a past medical history of Bipolar 1 disorder; Hypertension; Thyroid disease; GERD (gastroesophageal reflux disease); and Obstructive sleep apnea.   Presented with  It is diffuuclt to obtain hx from the patient due to chronic slurred speech. He is telling me he is here to check on his CPAP machine. Per the records that came from assisted living he has hx of Bipolar and have had some fevers recently. He is afebrile in ER. Per EMS he have had abdominal pain for 1 week. And some subjective fever.  He did tell ER MD that he felt short of breath worse than usual.  During interview he repeatedly falls asleep and has episodes of sleep apnea.  Patient repeatedly asking for water and is urinating large amount of clear urine.   Review of Systems:    Pertinent positives include:  shortness of breath at rest.   Constitutional:  No weight loss, night sweats, Fevers, chills, fatigue, weight loss  HEENT:  No headaches, Difficulty swallowing,Tooth/dental problems,Sore throat,  No sneezing, itching, ear ache, nasal congestion, post nasal drip,  Cardio-vascular:  No chest pain, Orthopnea, PND, anasarca, dizziness, palpitations.no Bilateral lower extremity swelling  GI:  No heartburn, indigestion, abdominal pain, nausea, vomiting, diarrhea, change in bowel habits, loss of appetite, melena, blood in stool, hematemesis Resp:  No excess mucus, no productive cough, No non-productive cough, No coughing up of blood.No change in color of mucus.No wheezing. Skin:  no rash or lesions. No jaundice GU:  no dysuria, change in color of urine, no urgency or frequency. No straining to urinate.  No flank pain.  Musculoskeletal:  No joint pain or no joint swelling. No decreased range of motion. No back pain.  Psych:  No change in mood or affect. No depression or  anxiety. No memory loss.  Neuro: no localizing neurological complaints, no tingling, no weakness, no double vision, no gait abnormality, no slurred speech, no confusion  Otherwise ROS are negative except for above, 10 systems were reviewed  Past Medical History: Past Medical History  Diagnosis Date  . Bipolar 1 disorder   . Hypertension   . Thyroid disease   . GERD (gastroesophageal reflux disease)   . Obstructive sleep apnea    History reviewed. No pertinent past surgical history.   Medications: Prior to Admission medications   Medication Sig Start Date End Date Taking? Authorizing Provider  acetaminophen (TYLENOL) 325 MG tablet Take 650 mg by mouth every 6 (six) hours as needed. For pain.    Yes Historical Provider, MD  amLODipine (NORVASC) 10 MG tablet Take 10 mg by mouth daily.     Yes Historical Provider, MD  aspirin 325 MG tablet Take 325 mg by mouth daily. Take 30 min prior to Niaspan.    Yes Historical Provider, MD  benazepril (LOTENSIN) 20 MG tablet Take 20 mg by mouth daily.     Yes Historical Provider, MD  benztropine (COGENTIN) 1 MG tablet Take 1 mg by mouth 2 (two) times daily.     Yes Historical Provider, MD  Cholecalciferol (VITAMIN D3) 1000 UNITS CAPS Take 1 capsule by mouth daily.     Yes Historical Provider, MD  dimenhyDRINATE (DRAMAMINE) 50 MG tablet Take 50 mg by mouth every 6 (six) hours as needed. For nausea/vomiting   Yes Historical Provider, MD  divalproex (  DEPAKOTE) 500 MG DR tablet Take 500 mg by mouth 2 (two) times daily.     Yes Historical Provider, MD  docusate sodium (COLACE) 100 MG capsule Take 100 mg by mouth 2 (two) times daily.     Yes Historical Provider, MD  ferrous sulfate 325 (65 FE) MG tablet Take 325 mg by mouth daily.     Yes Historical Provider, MD  guaifenesin (TUSSIN) 100 MG/5ML syrup Take 200 mg by mouth 4 (four) times daily as needed. For congestion.    Yes Historical Provider, MD  haloperidol decanoate (HALDOL DECANOATE) 50 MG/ML injection  Inject 75 mg into the muscle every 28 (twenty-eight) days.   Yes Historical Provider, MD  hydrochlorothiazide (HYDRODIURIL) 25 MG tablet Take 25 mg by mouth daily.     Yes Historical Provider, MD  levothyroxine (SYNTHROID, LEVOTHROID) 75 MCG tablet Take 75 mcg by mouth daily.     Yes Historical Provider, MD  loperamide (IMODIUM) 2 MG capsule Take 2 mg by mouth 4 (four) times daily as needed. For loose stool   Yes Historical Provider, MD  LORazepam (ATIVAN) 1 MG tablet Take 1 mg by mouth every 6 (six) hours as needed. For agitation   Yes Historical Provider, MD  LORazepam (ATIVAN) 1 MG tablet Take 1 mg by mouth 4 (four) times daily.    Yes Historical Provider, MD  meloxicam (MOBIC) 7.5 MG tablet Take 7.5 mg by mouth daily.   Yes Historical Provider, MD  Multiple Vitamins-Minerals (MULTIVITAMINS THER. W/MINERALS) TABS Take 1 tablet by mouth daily.     Yes Historical Provider, MD  niacin (NIASPAN) 750 MG CR tablet Take 750 mg by mouth daily.     Yes Historical Provider, MD  omeprazole (PRILOSEC) 20 MG capsule Take 20 mg by mouth daily.     Yes Historical Provider, MD  potassium chloride (K-DUR) 10 MEQ tablet Take 10 mEq by mouth daily.   Yes Historical Provider, MD  QUEtiapine (SEROQUEL) 200 MG tablet Take 200 mg by mouth daily.   Yes Historical Provider, MD  QUEtiapine (SEROQUEL) 300 MG tablet Take 300 mg by mouth at bedtime.   Yes Historical Provider, MD  sertraline (ZOLOFT) 100 MG tablet Take 150 mg by mouth daily.     Yes Historical Provider, MD  simvastatin (ZOCOR) 20 MG tablet Take 20 mg by mouth at bedtime.     Yes Historical Provider, MD  tobramycin-dexamethasone [facility]) ophthalmic solution Place 1 drop into both eyes 4 (four) times daily.     Yes Historical Provider, MD  traZODone (DESYREL) 50 MG tablet Take 25 mg by mouth at bedtime. For sleep.    Yes Historical Provider, MD  zolpidem (AMBIEN) 10 MG tablet Take 5 mg by mouth at bedtime as needed. For sleep.   Yes Historical Provider, MD    diphenhydrAMINE (BENADRYL) 25 MG tablet Take 25 mg by mouth every 4 (four) hours as needed. For itching, insomnia or allergy.     Historical Provider, MD  magnesium hydroxide (MILK OF MAGNESIA) 400 MG/5ML suspension Take 30 mLs by mouth daily as needed. For constipation.     Historical Provider, MD    Allergies:  No Known Allergies  Social History:  Ambulatory   independently Lives at [facility]   reports that he has been smoking.  He does not have any smokeless tobacco history on file. He reports that he drinks alcohol. He reports that he does not use illicit drugs.   Family History: family history includes Cirrhosis in  his mother; Diabetes Mellitus II in his mother; and Hypertension in his mother.    Physical Exam: Patient Vitals for the past 24 hrs:  BP Temp Temp src Pulse Resp SpO2  10/30/12 0053 140/95 mmHg - - 113  - -  10/30/12 0050 147/97 mmHg - - 108  - -  10/30/12 0049 151/81 mmHg - - 105  - -  10/29/12 2330 166/107 mmHg 98.4 F (36.9 C) Oral 105  18  93 %  10/29/12 2329 - - - - - 99 %    1. General:  in No Acute distress 2. Psychological: Sleepy not fully oriented to the situation 3. Head/ENT:   Moist  Mucous Membranes                          Head Non traumatic, neck supple                          Normal  Dentition 4. SKIN: normal Skin turgor,  Skin clean Dry and intact no rash 5. Heart: Regular rate and rhythm no Murmur, Rub or gallop 6. Lungs: Clear to auscultation bilaterally, severe snoring while sleeping 7. Abdomen: Soft, non-tender, slightly distended 8. Lower extremities: no clubbing, cyanosis, or edema 9. Neurologically Grossly intact, moving all 4 extremities equally 10. MSK: Normal range of motion  body mass index is unknown because there is no height or weight on file.   Labs on Admission:   [facility] 10/29/12 2345  NA 134*  K 3.6  CL 92*  CO2 29  GLUCOSE 119*  BUN 15  CREATININE 1.11  CALCIUM 9.3  MG --  PHOS --     Basename 10/29/12 2345  AST 23  ALT 20  ALKPHOS 80  BILITOT 0.1*  PROT 7.3  ALBUMIN 3.7    Basename 10/29/12 2345  LIPASE 32  AMYLASE --    Basename 10/29/12 2345  WBC 3.9*  NEUTROABS 2.1  HGB 10.8*  HCT 33.3*  MCV 86.3  PLT 261    Basename 10/29/12 2346  CKTOTAL --  CKMB --  CKMBINDEX --  TROPONINI <0.30   No results found for this basename: TSH,T4TOTAL,FREET3,T3FREE,THYROIDAB in the last 72 hours No results found for this basename: VITAMINB12:2,FOLATE:2,FERRITIN:2,TIBC:2,IRON:2,RETICCTPCT:2 in the last 72 hours No results found for this basename: HGBA1C    CrCl is unknown because there is no height on file for the current visit. ABG    Component Value Date/Time   HCO3 30.1* 05/19/2007 2152   TCO2 26 08/01/2008 1353     Lab Results  Component Value Date   DDIMER 0.43 10/30/2012     Other results:  I have pearsonaly reviewed this: ECG REPORT  Rate: 105  Rhythm: Sinus tachycardia, LVH, early repolarization ST&T Change: non-specific changes, no old ECG  UA dilute, no evidence of infection    Cultures: No results found for this basename: sdes, specrequest, cult, reptstatus       Radiological Exams on Admission: Dg Abd Acute W/chest  10/30/2012  *RADIOLOGY REPORT*  Clinical Data: Cough for 1 month.  Abdominal pain with coughing.  ACUTE ABDOMEN SERIES (ABDOMEN 2 VIEW & CHEST 1 VIEW)  Comparison: PA and lateral chest 08/01/2008.  Plain film of the abdomen 07/12/2008.  Findings: Single view of the chest demonstrates clear lungs and normal heart size.  No pneumothorax or pleural fluid.  Two views of the abdomen show no free intraperitoneal air.  Large stool  burden noted.  The patient has degenerative disease about the hips, worse on the right.  IMPRESSION: No acute finding.  Large stool burden throughout the colon.   Original Report Authenticated By: Holley Dexter, M.D.     Chart has been reviewed  Assessment/Plan  52 yo M w hx of Bipolar disorder  here with, severe constipation, poorly treated sleep apnea and Shortness of breath.   Present on Admission:  . Shortness of breath - atypical presentation, will cycle CE, and observe on tele . HTN (hypertension) - continue home medications . Hypoxia - continue oxygen, D.dimer is negative, CXR is unremarkable. His hypoxia seems to be due to sleep apnea mainly. . Bipolar 1 disorder - continue home medications . Obstructive sleep apnea -CPAP while asleep . Constipation - bowel regimen   Prophylaxis:   Lovenox, Protonix  CODE STATUS: FULL CODE according to the patient   Other plan as per orders.  I have spent a total of 55 min on this admission  Mayfield Schoene 10/30/2012, 3:31 AM

## 2012-10-31 DIAGNOSIS — I10 Essential (primary) hypertension: Secondary | ICD-10-CM

## 2012-10-31 DIAGNOSIS — K5900 Constipation, unspecified: Secondary | ICD-10-CM

## 2012-10-31 DIAGNOSIS — J441 Chronic obstructive pulmonary disease with (acute) exacerbation: Secondary | ICD-10-CM

## 2012-10-31 DIAGNOSIS — F319 Bipolar disorder, unspecified: Secondary | ICD-10-CM

## 2012-10-31 LAB — PHOSPHORUS: Phosphorus: 3.2 mg/dL (ref 2.3–4.6)

## 2012-10-31 LAB — BASIC METABOLIC PANEL
GFR calc Af Amer: 83 mL/min — ABNORMAL LOW (ref >90)
GFR calc non Af Amer: 71 mL/min — ABNORMAL LOW (ref >90)
Potassium: 4.2 mEq/L (ref 3.5–5.1)
Sodium: 137 mEq/L (ref 135–145)

## 2012-10-31 LAB — BLOOD GAS, ARTERIAL
Acid-Base Excess: 5.9 mmol/L — ABNORMAL HIGH (ref 0.0–2.0)
Bicarbonate: 31 mEq/L — ABNORMAL HIGH (ref 20.0–24.0)
Delivery systems: POSITIVE
Mode: POSITIVE
Patient temperature: 98.6
TCO2: 32.6 mmol/L (ref 0–100)

## 2012-10-31 MED ORDER — SENNA 8.6 MG PO TABS: 1.0000 | ORAL_TABLET | Freq: Every day | ORAL | Status: DC | PRN

## 2012-10-31 MED ORDER — POLYETHYLENE GLYCOL 3350 17 G PO PACK: 17.0000 g | PACK | Freq: Every day | ORAL | Status: DC | PRN

## 2012-10-31 MED ORDER — DSS 100 MG PO CAPS: 200.0000 mg | ORAL_CAPSULE | Freq: Two times a day (BID) | ORAL | Status: DC

## 2012-10-31 MED ORDER — NIACIN ER 250 MG PO CPCR
750.0000 mg | ORAL_CAPSULE | Freq: Every day | ORAL | Status: DC
  Administered 2012-10-31: 750 mg via ORAL
  Filled 2012-10-31 (×2): qty 3

## 2012-10-31 MED ORDER — QUETIAPINE FUMARATE 200 MG PO TABS: 200.0000 mg | ORAL_TABLET | Freq: Two times a day (BID) | ORAL | Status: DC

## 2012-10-31 NOTE — Progress Notes (Signed)
Utilization Review Completed.Jaanvi Fizer T1/04/2013   

## 2012-10-31 NOTE — Progress Notes (Signed)
Pt placed on 14cm H2O via nasal CPAP mask with no O2 bleed in. Pt has been on RA all day. Pt tolerating well, RN aware. Will monitor.

## 2012-10-31 NOTE — Progress Notes (Signed)
Chaplain visited patient during his courtesy rounds on the Unit. Patient was awake and responsive but appeared to be experiencing shortness of breath. Chaplain provided ministry of presence and empathic listening. Chaplain encouraged, comforted and prayed for patient. Chaplain will follow-up as needed. Patient thanked Chaplain for the visit and presence.

## 2012-10-31 NOTE — Care Management Note (Signed)
    Page 1 of 1   10/31/2012     11:01:14 AM   CARE MANAGEMENT NOTE 10/31/2012  Patient:  [name],[name]   Account Number:  [id number]  Date Initiated:  10/31/2012  Documentation initiated by:  Junius Creamer  Subjective/Objective Assessment:   adm w shortness of breath     Action/Plan:   from [facility]   Anticipated DC Date:  10/31/2012   Anticipated DC Plan:  ASSISTED LIVING / REST HOME  In-house referral  Clinical Social Worker      DC Planning Services  CM consult      Choice offered to / List presented to:     DME arranged  CPAP      DME agency  [facility]        Status of service:   Medicare Important Message given?   (If response is "NO", the following Medicare IM given date fields will be blank) Date Medicare IM given:   Date Additional Medicare IM given:    Discharge Disposition:  ASSISTED LIVING  Per UR Regulation:  Reviewed for med. necessity/level of care/duration of stay  If discussed at Long Length of Stay Meetings, dates discussed:    Comments:  10/31/12 1100 debbie Latanja Lehenbauer rn,bsn have faxed order for cpap to [facility] who provides cpap but they say his eq is old and they may have to replace and needed new order. faxed this to [email or web address] to assist getting pt back to [facility].

## 2012-10-31 NOTE — Discharge Summary (Signed)
[facility]                                                                                   [name], is a 52 y.o. male  DOB 07/18/1961  MRN [id number].  Admission date:  10/29/2012  Discharge Date:  10/31/2012  Primary MD  Pcp Not In System  Admitting Physician  Therisa Doyne, MD  Admission Diagnosis  HTN (hypertension) [401.9] Hypoxia [799.02] COPD exacerbation [491.21] Bipolar 1 disorder [296.7] Constipation [564.00] Shortness of breath  Discharge Diagnosis     Active Problems:  Shortness of breath  HTN (hypertension)  Hypoxia  Bipolar 1 disorder  Obstructive sleep apnea  Constipation   Past Medical History  Diagnosis Date  . Bipolar 1 disorder   . Hypertension   . Thyroid disease   . GERD (gastroesophageal reflux disease)   . Obstructive sleep apnea     History reviewed. No pertinent past surgical history.   Recommendations for primary care physician for things to follow:   Follow patient's sedative medications closely, Ensure home CPAP is working   Discharge Diagnoses:   Active Problems:  Shortness of breath  HTN (hypertension)  Hypoxia  Bipolar 1 disorder  Obstructive sleep apnea  Constipation    Discharge Condition: Stable   Diet recommendation: See Discharge Instructions below   Consults case management to assist with home CPAP machine   History of present illness and  Hospital Course:  See H&P, Labs, Consult and Test reports for all details in brief, patient was admitted for severe constipation and some drowsiness, he has underlying history of severe obstructive sleep apnea with CPAP at home, there was question at home CPAP machine was not working, he also has bipolar disorder and is on multiple psych medications which can make him drowsy, he also takes antihistamine medications along with benzos. Patient's ABGs were stable on CPAP of 12 which was applied here, he is completely alert awake oriented x3 and  following commands without any problems today, he was treated with his home setting of CPAP in the slept, bowel regimen with good results, I have titrated his sedative medications, stop his antihistamines, we'll request him to follow with PCP and psychiatrist in 3 days, try to keep him on lowest possible doses of sedative medications.   He is being discharged in stable condition, case management will have home equipment team evaluate his home CPAP machine to ensure its working.      Today   Subjective:   Lexton Hidalgo today has no headache,no chest abdominal pain,no new weakness tingling or numbness, feels much better wants to go home today.    Objective:   Blood pressure 153/87, pulse 86, temperature 98.2 F (36.8 C), temperature source Oral, resp. rate 18, height 5\' 6"  (1.676 m), weight 78.472 kg (173 lb), SpO2 100.00%.   Intake/Output Summary (Last 24 hours) at 10/31/12 0940 Last data filed at 10/31/12 0034  Gross per 24 hour  Intake   1080 ml  Output   1550 ml  Net   -470 ml    Exam Awake Alert, Oriented *3, No new F.N deficits, Normal affect [place].AT,PERRAL Supple Neck,No JVD, No cervical lymphadenopathy  appriciated.  Symmetrical Chest wall movement, Good air movement bilaterally, CTAB RRR,No Gallops,Rubs or new Murmurs, No Parasternal Heave +ve B.Sounds, Abd Soft, Non tender, No organomegaly appriciated, No rebound -guarding or rigidity. No Cyanosis, Clubbing or edema, No new Rash or bruise  Data Review   Major procedures and Radiology Reports - PLEASE review detailed and final reports for all details in brief -    Dg Abd Acute W/chest  10/30/2012  *RADIOLOGY REPORT*  Clinical Data: Cough for 1 month.  Abdominal pain with coughing.  ACUTE ABDOMEN SERIES (ABDOMEN 2 VIEW & CHEST 1 VIEW)  Comparison: PA and lateral chest 08/01/2008.  Plain film of the abdomen 07/12/2008.  Findings: Single view of the chest demonstrates clear lungs and normal heart size.  No pneumothorax or  pleural fluid.  Two views of the abdomen show no free intraperitoneal air.  Large stool burden noted.  The patient has degenerative disease about the hips, worse on the right.  IMPRESSION: No acute finding.  Large stool burden throughout the colon.   Original Report Authenticated By: Holley Dexter, M.D.     Micro Results     Recent Results (from the past 240 hour(s))  RAPID STREP SCREEN     Status: Normal   Collection Time   10/30/12 12:44 AM      Component Value Range Status Comment   Streptococcus, Group A Screen (Direct) NEGATIVE  NEGATIVE Final   MRSA PCR SCREENING     Status: Normal   Collection Time   10/30/12  7:35 AM      Component Value Range Status Comment   MRSA by PCR NEGATIVE  NEGATIVE Final      CBC w Diff: Lab Results  Component Value Date   WBC 5.6 10/30/2012   HGB 10.5* 10/30/2012   HCT 33.1* 10/30/2012   PLT 244 10/30/2012   LYMPHOPCT 20 10/29/2012   MONOPCT 24* 10/29/2012   EOSPCT 3 10/29/2012   BASOPCT 0 10/29/2012    CMP: Lab Results  Component Value Date   NA 137 10/31/2012   K 4.2 10/31/2012   CL 97 10/31/2012   CO2 32 10/31/2012   BUN 15 10/31/2012   CREATININE 1.16 10/31/2012   PROT 7.5 10/30/2012   ALBUMIN 3.6 10/30/2012   BILITOT 0.1* 10/30/2012   ALKPHOS 79 10/30/2012   AST 23 10/30/2012   ALT 19 10/30/2012  .   Discharge Instructions     Follow with Primary MD and your psychiatrist in 3 days   Get CBC, CMP, checked 3 days by Primary MD and again as instructed by your Primary MD. Get a 2 view Chest X ray done next visit.  Get Medicines reviewed and adjusted.  Please request your Prim.MD to go over all Hospital Tests and Procedure/Radiological results at the follow up, please get all Hospital records sent to your Prim MD by signing hospital release before you go home.  Activity: As tolerated with Full fall precautions use walker/cane & assistance as needed   Diet:  Heart healthy  For Heart failure patients - Check your Weight same time everyday, if you gain over 2  pounds, or you develop in leg swelling, experience more shortness of breath or chest pain, call your Primary MD immediately. Follow Cardiac Low Salt Diet and 1.8 lit/day fluid restriction.  Disposition assisted living  If you experience worsening of your admission symptoms, develop shortness of breath, life threatening emergency, suicidal or homicidal thoughts you must seek medical attention immediately by calling 911 or  calling your MD immediately  if symptoms less severe.  You Must read complete instructions/literature along with all the possible adverse reactions/side effects for all the Medicines you take and that have been prescribed to you. Take any new Medicines after you have completely understood and accpet all the possible adverse reactions/side effects.   Do not drive and provide baby sitting services if your were admitted for syncope or siezures until you have seen by Primary MD or a Neurologist and advised to do so again.  Do not drive when taking Pain medications.    Do not take more than prescribed Pain, Sleep and Anxiety Medications  Special Instructions: If you have smoked or chewed Tobacco  in the last 2 yrs please stop smoking, stop any regular Alcohol  and or any Recreational drug use.  Wear Seat belts while driving.  Follow-up Information    Follow up with Your primary care physician and your psychiatrist. Schedule an appointment as soon as possible for a visit in 3 days.           Discharge Medications     Medication List     As of 10/31/2012  9:40 AM    START taking these medications         * DSS 100 MG Caps   Take 200 mg by mouth 2 (two) times daily.      polyethylene glycol packet   Commonly known as: MIRALAX / GLYCOLAX   Take 17 g by mouth daily as needed.      senna 8.6 MG Tabs   Commonly known as: SENOKOT   Take 1 tablet (8.6 mg total) by mouth daily as needed.     * Notice: This list has 1 medication(s) that are the same as other medications  prescribed for you. Read the directions carefully, and ask your doctor or other care provider to review them with you.    CHANGE how you take these medications         QUEtiapine 200 MG tablet   Commonly known as: SEROQUEL   Take 1 tablet (200 mg total) by mouth 2 (two) times daily.   What changed: how often to take the med      CONTINUE taking these medications         acetaminophen 325 MG tablet   Commonly known as: TYLENOL      amLODipine 10 MG tablet   Commonly known as: NORVASC      aspirin 325 MG tablet      benazepril 20 MG tablet   Commonly known as: LOTENSIN      benztropine 1 MG tablet   Commonly known as: COGENTIN      divalproex 500 MG DR tablet   Commonly known as: DEPAKOTE      * docusate sodium 100 MG capsule   Commonly known as: COLACE      ferrous sulfate 325 (65 FE) MG tablet      haloperidol decanoate 50 MG/ML injection   Commonly known as: HALDOL DECANOATE      hydrochlorothiazide 25 MG tablet   Commonly known as: HYDRODIURIL      levothyroxine 75 MCG tablet   Commonly known as: SYNTHROID, LEVOTHROID      loperamide 2 MG capsule   Commonly known as: IMODIUM      LORazepam 1 MG tablet   Commonly known as: ATIVAN      magnesium hydroxide 400 MG/5ML suspension   Commonly known as: MILK OF MAGNESIA  meloxicam 7.5 MG tablet   Commonly known as: MOBIC      multivitamins ther. w/minerals Tabs      niacin 750 MG CR tablet   Commonly known as: NIASPAN      omeprazole 20 MG capsule   Commonly known as: PRILOSEC      potassium chloride 10 MEQ tablet   Commonly known as: K-DUR      sertraline 100 MG tablet   Commonly known as: ZOLOFT      simvastatin 20 MG tablet   Commonly known as: ZOCOR      tobramycin-dexamethasone ophthalmic solution   Commonly known as: TOBRADEX      TUSSIN 100 MG/5ML syrup   Generic drug: guaifenesin      Vitamin D3 1000 UNITS Caps      zolpidem 10 MG tablet   Commonly known as: AMBIEN     * Notice:  This list has 1 medication(s) that are the same as other medications prescribed for you. Read the directions carefully, and ask your doctor or other care provider to review them with you.    STOP taking these medications         dimenhyDRINATE 50 MG tablet   Commonly known as: DRAMAMINE      diphenhydrAMINE 25 MG tablet   Commonly known as: BENADRYL      traZODone 50 MG tablet   Commonly known as: DESYREL          Where to get your medications    These are the prescriptions that you need to pick up.   You may get these medications from any pharmacy.         DSS 100 MG Caps   polyethylene glycol packet   QUEtiapine 200 MG tablet   senna 8.6 MG Tabs               Total Time in preparing paper work, data evaluation and todays exam - 35 minutes  Leroy Sea M.D on 10/31/2012 at 9:41 AM  [facility] Office  [phone]

## 2012-11-01 ENCOUNTER — Ambulatory Visit (HOSPITAL_COMMUNITY): Admission: RE | Admit: 2012-11-01 | Payer: Medicare Other | Source: Ambulatory Visit

## 2012-11-01 LAB — BASIC METABOLIC PANEL
CO2: 28 mEq/L (ref 19–32)
Calcium: 9.8 mg/dL (ref 8.4–10.5)
Chloride: 92 mEq/L — ABNORMAL LOW (ref 96–112)
Creatinine, Ser: 1.24 mg/dL (ref 0.50–1.35)
Glucose, Bld: 117 mg/dL — ABNORMAL HIGH (ref 70–99)
Sodium: 133 mEq/L — ABNORMAL LOW (ref 135–145)

## 2012-11-01 MED ORDER — IBUPROFEN 400 MG PO TABS: 400.0000 mg | ORAL_TABLET | Freq: Once | ORAL | Status: DC

## 2012-11-01 MED ORDER — IBUPROFEN 800 MG PO TABS: 800.0000 mg | ORAL_TABLET | Freq: Once | ORAL | Status: DC

## 2012-11-01 NOTE — Progress Notes (Signed)
Clinical Social Work  Per [employer], patient can admit today after 12pm and confirmed that patient takes bus back to ALF from hospital. CSW informed patient and RN of plans. DC packet prepared and placed in [facility]. RN to give packet and bus pass to patient at time of dc. CSW is signing off but available if needed.  [place], [place] [phone]

## 2012-11-01 NOTE — Progress Notes (Signed)
Discharged to [facility] by bus office visits in place teaching done

## 2013-08-01 ENCOUNTER — Inpatient Hospital Stay (HOSPITAL_COMMUNITY)
Admission: EM | Admit: 2013-08-01 | Discharge: 2013-08-06 | DRG: 092 | Disposition: A | Discharge status: Home Health | Payer: Medicare Other | Attending: Internal Medicine | Admitting: Internal Medicine

## 2013-08-01 ENCOUNTER — Encounter (HOSPITAL_COMMUNITY): Payer: Self-pay | Admitting: Emergency Medicine

## 2013-08-01 ENCOUNTER — Emergency Department (HOSPITAL_COMMUNITY): Payer: Medicare Other

## 2013-08-01 DIAGNOSIS — I10 Essential (primary) hypertension: Secondary | ICD-10-CM | POA: Diagnosis present

## 2013-08-01 DIAGNOSIS — M6282 Rhabdomyolysis: Secondary | ICD-10-CM | POA: Diagnosis present

## 2013-08-01 DIAGNOSIS — Z9119 Patient's noncompliance with other medical treatment and regimen: Secondary | ICD-10-CM

## 2013-08-01 DIAGNOSIS — K219 Gastro-esophageal reflux disease without esophagitis: Secondary | ICD-10-CM | POA: Diagnosis present

## 2013-08-01 DIAGNOSIS — F17200 Nicotine dependence, unspecified, uncomplicated: Secondary | ICD-10-CM | POA: Diagnosis present

## 2013-08-01 DIAGNOSIS — F1010 Alcohol abuse, uncomplicated: Secondary | ICD-10-CM | POA: Diagnosis present

## 2013-08-01 DIAGNOSIS — G210 Malignant neuroleptic syndrome: Principal | ICD-10-CM | POA: Diagnosis present

## 2013-08-01 DIAGNOSIS — J4489 Other specified chronic obstructive pulmonary disease: Secondary | ICD-10-CM | POA: Diagnosis present

## 2013-08-01 DIAGNOSIS — Z7982 Long term (current) use of aspirin: Secondary | ICD-10-CM

## 2013-08-01 DIAGNOSIS — E872 Acidosis, unspecified: Secondary | ICD-10-CM | POA: Diagnosis present

## 2013-08-01 DIAGNOSIS — R339 Retention of urine, unspecified: Secondary | ICD-10-CM | POA: Diagnosis not present

## 2013-08-01 DIAGNOSIS — IMO0001 Reserved for inherently not codable concepts without codable children: Secondary | ICD-10-CM

## 2013-08-01 DIAGNOSIS — Z79899 Other long term (current) drug therapy: Secondary | ICD-10-CM

## 2013-08-01 DIAGNOSIS — J449 Chronic obstructive pulmonary disease, unspecified: Secondary | ICD-10-CM | POA: Diagnosis present

## 2013-08-01 DIAGNOSIS — N179 Acute kidney failure, unspecified: Secondary | ICD-10-CM | POA: Diagnosis present

## 2013-08-01 DIAGNOSIS — F319 Bipolar disorder, unspecified: Secondary | ICD-10-CM | POA: Diagnosis present

## 2013-08-01 DIAGNOSIS — G4733 Obstructive sleep apnea (adult) (pediatric): Secondary | ICD-10-CM | POA: Diagnosis present

## 2013-08-01 DIAGNOSIS — Z91199 Patient's noncompliance with other medical treatment and regimen due to unspecified reason: Secondary | ICD-10-CM

## 2013-08-01 DIAGNOSIS — F259 Schizoaffective disorder, unspecified: Secondary | ICD-10-CM | POA: Diagnosis present

## 2013-08-01 DIAGNOSIS — R4182 Altered mental status, unspecified: Secondary | ICD-10-CM | POA: Diagnosis present

## 2013-08-01 HISTORY — DX: Chronic obstructive pulmonary disease, unspecified: J44.9

## 2013-08-01 LAB — URINE MICROSCOPIC-ADD ON

## 2013-08-01 LAB — COMPREHENSIVE METABOLIC PANEL
ALT: 86 U/L — ABNORMAL HIGH (ref 0–53)
AST: 283 U/L — ABNORMAL HIGH (ref 0–37)
Albumin: 4.5 g/dL (ref 3.5–5.2)
Alkaline Phosphatase: 81 U/L (ref 39–117)
Calcium: 10.2 mg/dL (ref 8.4–10.5)
GFR calc Af Amer: 51 mL/min — ABNORMAL LOW (ref >90)
Glucose, Bld: 113 mg/dL — ABNORMAL HIGH (ref 70–99)
Potassium: 3.4 mEq/L — ABNORMAL LOW (ref 3.5–5.1)
Sodium: 136 mEq/L (ref 135–145)
Total Protein: 8.9 g/dL — ABNORMAL HIGH (ref 6.0–8.3)

## 2013-08-01 LAB — CBC WITH DIFFERENTIAL/PLATELET
Basophils Absolute: 0 10*3/uL (ref 0.0–0.1)
Basophils Relative: 0 % (ref 0–1)
Eosinophils Absolute: 0 10*3/uL (ref 0.0–0.7)
Eosinophils Relative: 0 % (ref 0–5)
Lymphs Abs: 1.4 10*3/uL (ref 0.7–4.0)
MCH: 30.3 pg (ref 26.0–34.0)
MCV: 86.7 fL (ref 78.0–100.0)
Neutrophils Relative %: 72 % (ref 43–77)
Platelets: 205 10*3/uL (ref 150–400)
RBC: 4.72 MIL/uL (ref 4.22–5.81)
RDW: 14.3 % (ref 11.5–15.5)

## 2013-08-01 LAB — CBC
Hemoglobin: 12.3 g/dL — ABNORMAL LOW (ref 13.0–17.0)
MCHC: 33.1 g/dL (ref 30.0–36.0)
MCV: 87.5 fL (ref 78.0–100.0)
RBC: 4.25 MIL/uL (ref 4.22–5.81)
WBC: 10.5 10*3/uL (ref 4.0–10.5)

## 2013-08-01 LAB — CSF CELL COUNT WITH DIFFERENTIAL
Eosinophils, CSF: NONE SEEN % (ref 0–1)
RBC Count, CSF: 36900 /mm3 — ABNORMAL HIGH
Tube #: 1
WBC, CSF: 4 /mm3 (ref 0–5)

## 2013-08-01 LAB — URINALYSIS, ROUTINE W REFLEX MICROSCOPIC
Bilirubin Urine: NEGATIVE
Specific Gravity, Urine: 1.02 (ref 1.005–1.030)
Urobilinogen, UA: 0.2 mg/dL (ref 0.0–1.0)
pH: 5 (ref 5.0–8.0)

## 2013-08-01 LAB — RAPID HIV SCREEN (WH-MAU): Rapid HIV Screen: NONREACTIVE

## 2013-08-01 LAB — CREATININE, SERUM
Creatinine, Ser: 1.49 mg/dL — ABNORMAL HIGH (ref 0.50–1.35)
GFR calc Af Amer: 61 mL/min — ABNORMAL LOW (ref >90)
GFR calc non Af Amer: 52 mL/min — ABNORMAL LOW (ref >90)

## 2013-08-01 LAB — RAPID URINE DRUG SCREEN, HOSP PERFORMED
Amphetamines: NOT DETECTED
Barbiturates: NOT DETECTED
Cocaine: NOT DETECTED
Tetrahydrocannabinol: NOT DETECTED

## 2013-08-01 LAB — CK TOTAL AND CKMB (NOT AT ARMC)
CK, MB: 35.5 ng/mL (ref 0.3–4.0)
Relative Index: 0.2 (ref 0.0–2.5)
Total CK: 15692 U/L — ABNORMAL HIGH (ref 7–232)

## 2013-08-01 LAB — GRAM STAIN

## 2013-08-01 LAB — PROTIME-INR
INR: 1.24 (ref 0.00–1.49)
Prothrombin Time: 15.3 seconds — ABNORMAL HIGH (ref 11.6–15.2)

## 2013-08-01 LAB — TSH: TSH: 6.108 u[IU]/mL — ABNORMAL HIGH (ref 0.350–4.500)

## 2013-08-01 LAB — PROTEIN AND GLUCOSE, CSF
Glucose, CSF: 93 mg/dL — ABNORMAL HIGH (ref 43–76)
Total  Protein, CSF: 64 mg/dL — ABNORMAL HIGH (ref 15–45)

## 2013-08-01 LAB — PHOSPHORUS: Phosphorus: 3.2 mg/dL (ref 2.3–4.6)

## 2013-08-01 MED ORDER — QUETIAPINE FUMARATE 200 MG PO TABS: 200.0000 mg | ORAL_TABLET | Freq: Two times a day (BID) | ORAL | Status: DC

## 2013-08-01 MED ORDER — LEVOTHYROXINE SODIUM 100 MCG IV SOLR
40.0000 ug | Freq: Every day | INTRAVENOUS | Status: DC
  Administered 2013-08-02 – 2013-08-03 (×2): 40 ug via INTRAVENOUS
  Filled 2013-08-01 (×2): qty 5

## 2013-08-01 MED ORDER — BENZTROPINE MESYLATE 1 MG PO TABS
1.0000 mg | ORAL_TABLET | Freq: Two times a day (BID) | ORAL | Status: DC
  Administered 2013-08-01: 21:00:00 1 mg via ORAL
  Filled 2013-08-01: qty 1

## 2013-08-01 MED ORDER — METOPROLOL TARTRATE 25 MG PO TABS
25.0000 mg | ORAL_TABLET | Freq: Two times a day (BID) | ORAL | Status: DC
  Filled 2013-08-01: qty 1

## 2013-08-01 MED ORDER — SODIUM CHLORIDE 0.9 % IV BOLUS (SEPSIS)
1000.0000 mL | Freq: Once | INTRAVENOUS | Status: AC
  Administered 2013-08-01: 1000 mL via INTRAVENOUS

## 2013-08-01 MED ORDER — LORAZEPAM 1 MG PO TABS: 1.0000 mg | ORAL_TABLET | Freq: Three times a day (TID) | ORAL | Status: DC

## 2013-08-01 MED ORDER — METOPROLOL TARTRATE 1 MG/ML IV SOLN
5.0000 mg | Freq: Four times a day (QID) | INTRAVENOUS | Status: DC | PRN
  Administered 2013-08-02: 5 mg via INTRAVENOUS
  Filled 2013-08-01 (×2): qty 5

## 2013-08-01 MED ORDER — LORAZEPAM 2 MG/ML IJ SOLN: 1.0000 mg | Freq: Four times a day (QID) | INTRAMUSCULAR | Status: DC | PRN

## 2013-08-01 MED ORDER — SODIUM CHLORIDE 0.9 % IV BOLUS (SEPSIS): 1000.0000 mL | Freq: Once | INTRAVENOUS | Status: DC

## 2013-08-01 MED ORDER — LEVOTHYROXINE SODIUM 75 MCG PO TABS
75.0000 ug | ORAL_TABLET | Freq: Every day | ORAL | Status: DC
  Filled 2013-08-01: qty 1

## 2013-08-01 MED ORDER — AMLODIPINE BESYLATE 10 MG PO TABS: 10.0000 mg | ORAL_TABLET | Freq: Every day | ORAL | Status: DC

## 2013-08-01 MED ORDER — DIVALPROEX SODIUM 500 MG PO DR TAB
500.0000 mg | DELAYED_RELEASE_TABLET | Freq: Two times a day (BID) | ORAL | Status: DC
  Filled 2013-08-01: qty 1

## 2013-08-01 MED ORDER — BENZTROPINE MESYLATE 1 MG/ML IJ SOLN
1.0000 mg | Freq: Two times a day (BID) | INTRAMUSCULAR | Status: DC
  Administered 2013-08-02 – 2013-08-06 (×9): 1 mg via INTRAVENOUS
  Filled 2013-08-01 (×13): qty 1

## 2013-08-01 MED ORDER — SODIUM CHLORIDE 0.9 % IV SOLN
250.0000 mL | INTRAVENOUS | Status: DC | PRN
  Administered 2013-08-02: 250 mL via INTRAVENOUS

## 2013-08-01 MED ORDER — SODIUM CHLORIDE 0.9 % IJ SOLN
3.0000 mL | Freq: Two times a day (BID) | INTRAMUSCULAR | Status: DC
  Administered 2013-08-02 – 2013-08-06 (×6): 3 mL via INTRAVENOUS

## 2013-08-01 MED ORDER — THIAMINE HCL 100 MG/ML IJ SOLN
Freq: Once | INTRAVENOUS | Status: AC
  Administered 2013-08-01: 20:00:00 via INTRAVENOUS
  Filled 2013-08-01: qty 1000

## 2013-08-01 MED ORDER — SODIUM CHLORIDE 0.9 % IV SOLN
INTRAVENOUS | Status: DC
  Administered 2013-08-02 (×2): via INTRAVENOUS
  Administered 2013-08-03: 150 mL/h via INTRAVENOUS
  Administered 2013-08-03 – 2013-08-04 (×4): via INTRAVENOUS
  Administered 2013-08-04 (×2): 150 mL/h via INTRAVENOUS
  Administered 2013-08-05: 1000 mL via INTRAVENOUS
  Administered 2013-08-05: 15:00:00 via INTRAVENOUS

## 2013-08-01 MED ORDER — SODIUM CHLORIDE 0.9 % IJ SOLN: 3.0000 mL | INTRAMUSCULAR | Status: DC | PRN

## 2013-08-01 MED ORDER — HEPARIN SODIUM (PORCINE) 5000 UNIT/ML IJ SOLN
5000.0000 [IU] | Freq: Three times a day (TID) | INTRAMUSCULAR | Status: DC
  Administered 2013-08-01 – 2013-08-05 (×11): 5000 [IU] via SUBCUTANEOUS
  Filled 2013-08-01 (×17): qty 1

## 2013-08-01 NOTE — ED Notes (Signed)
Called CT, pt is 3rd on the list to get. Pt updated.

## 2013-08-01 NOTE — Progress Notes (Signed)
CRITICAL VALUE ALERT  Critical value received:  Lumbar Puncture showed WBC both poly and monos, no organisms seen  Date of notification:  08/01/13  Time of notification:  18:15  Critical value read back:yes  Nurse who received alert:  Dianna Limbo  MD notified (1st page):  [facility]TS [phone]), Resident in room assessing patient, told verbally  Time of first page:  18:15  MD notified (2nd page):   Time of second page:  Responding MD:  Danise Edge ([phone])  Time MD responded: 18:15

## 2013-08-01 NOTE — ED Notes (Signed)
Returned from CT.

## 2013-08-01 NOTE — ED Provider Notes (Signed)
CSN: [id number]     Arrival date & time 08/01/13  1316 History   First MD Initiated Contact with Patient 08/01/13 1335     Chief Complaint  Patient presents with  . Altered Mental Status   (Consider location/radiation/quality/duration/timing/severity/associated sxs/prior Treatment) HPI  This a 52 year old male with history of bipolar disorder, hypertension, and thyroid disease who [facility] from jail with altered mental status and bilateral lower extremity weakness.  Patient presents by EMS. Per EMS report the patient has been incarcerated since 10/1. He was evaluated by nurse practitioner today and found to be covered in urine and feces. He was unable to take anything by mouth. He was last seen normal 2 days ago. It was noted yesterday that he was incontinent of urine and appeared to be a little "off." Per EMS report, he did not score on the stroke scale. The patient while slow to respond is alert and oriented x3. He denies any pain. He states he's never had anything like this before. He is unable to articulate a full history. Patient denies fevers, chest pain, shortness breath, back pain, abdominal pain.  He denies any history of IV drug use or HIV.  Past Medical History  Diagnosis Date  . Bipolar 1 disorder   . Hypertension   . Thyroid disease   . GERD (gastroesophageal reflux disease)   . Obstructive sleep apnea   . COPD (chronic obstructive pulmonary disease)    History reviewed. No pertinent past surgical history. Family History  Problem Relation Age of Onset  . Cirrhosis Mother   . Hypertension Mother   . Diabetes Mellitus II Mother    History  Substance Use Topics  . Smoking status: Current Every Day Smoker -- 0.50 packs/day    Types: Cigarettes  . Smokeless tobacco: Not on file  . Alcohol Use: Yes     Comment: about 2 beers every day.     Review of Systems  Constitutional: Negative for fever.  Respiratory: Negative.  Negative for chest tightness and shortness of breath.    Cardiovascular: Negative.  Negative for chest pain.  Gastrointestinal: Negative.  Negative for abdominal pain.  Genitourinary:       Urinary incontinence  Musculoskeletal: Negative for back pain.  Skin: Negative for rash.  Neurological: Positive for weakness. Negative for headaches.  Psychiatric/Behavioral: Positive for confusion.  All other systems reviewed and are negative.    Allergies  Review of patient's allergies indicates no known allergies.  Home Medications   No current outpatient prescriptions on file. BP 147/100  Pulse 83  Temp(Src) 98.2 F (36.8 C) (Oral)  Resp 28  SpO2 100% Physical Exam  Nursing note and vitals reviewed. Constitutional: He is oriented to person, place, and time.  Ill-appearing but nontoxic  HENT:  Head: Normocephalic and atraumatic.  Mucous membranes dry  Eyes: EOM are normal. Pupils are equal, round, and reactive to light.  Neck: Neck supple.  Cardiovascular: Normal rate, regular rhythm and normal heart sounds.   No murmur heard. Pulmonary/Chest: Effort normal and breath sounds normal. No respiratory distress. He has no wheezes.  Abdominal: Soft. Bowel sounds are normal. There is no tenderness. There is no rebound.  Genitourinary:  Decreased rectal tone, no gross blood  Musculoskeletal: He exhibits no edema.  Lymphadenopathy:    He has no cervical adenopathy.  Neurological: He is alert and oriented to person, place, and time.  Slow to respond but oriented x3, patient can name and repeat, 4/5 bilateral upper extremity strength in grip and  biceps, 1/5 bilateral lower extremity strength with normal reflexes. No clonus noted. No dysmetria on finger-nose-finger.  Skin: Skin is warm and dry.  Psychiatric: He has a normal mood and affect.    ED Course  LUMBAR PUNCTURE Date/Time: 08/01/2013 7:21 PM Performed by: Ross Marcus, F Authorized by: Ross Marcus, F Consent: Verbal consent obtained. written consent obtained. Risks and  benefits: risks, benefits and alternatives were discussed Consent given by: patient Patient identity confirmed: verbally with patient and arm band Indications: evaluation for altered mental status Anesthesia: local infiltration Anesthetic total: 5 ml Patient sedated: no Preparation: Patient was prepped and draped in the usual sterile fashion. Lumbar space: L3-L4 interspace Patient's position: right lateral decubitus Needle gauge: 20 Needle type: spinal needle - Quincke tip Needle length: 2.5 in Number of attempts: 2 Fluid appearance: blood-tinged then clearing Tubes of fluid: 4 Total volume: 4 ml Post-procedure: site cleaned and adhesive bandage applied Patient tolerance: Patient tolerated the procedure well with no immediate complications.   (including critical care time) Labs Review Labs Reviewed  URINE RAPID DRUG SCREEN (HOSP PERFORMED) - Abnormal; Notable for the following:    Benzodiazepines POSITIVE (*)    All other components within normal limits  CBC WITH DIFFERENTIAL - Abnormal; Notable for the following:    WBC 11.4 (*)    Neutro Abs 8.3 (*)    Monocytes Relative 15 (*)    Monocytes Absolute 1.7 (*)    All other components within normal limits  COMPREHENSIVE METABOLIC PANEL - Abnormal; Notable for the following:    Potassium 3.4 (*)    Chloride 95 (*)    Glucose, Bld 113 (*)    BUN 62 (*)    Creatinine, Ser 1.73 (*)    Total Protein 8.9 (*)    AST 283 (*)    ALT 86 (*)    Total Bilirubin 0.2 (*)    GFR calc non Af Amer 44 (*)    GFR calc Af Amer 51 (*)    All other components within normal limits  URINALYSIS, ROUTINE W REFLEX MICROSCOPIC - Abnormal; Notable for the following:    Hgb urine dipstick MODERATE (*)    Protein, ur >300 (*)    All other components within normal limits  PROTEIN AND GLUCOSE, CSF - Abnormal; Notable for the following:    Glucose, CSF 93 (*)    Total  Protein, CSF 64 (*)    All other components within normal limits  CSF CELL COUNT  WITH DIFFERENTIAL - Abnormal; Notable for the following:    Color, CSF RED (*)    Appearance, CSF CLOUDY (*)    RBC Count, CSF 36900 (*)    All other components within normal limits  URINE MICROSCOPIC-ADD ON - Abnormal; Notable for the following:    Casts HYALINE CASTS (*)    All other components within normal limits  CK TOTAL AND CKMB - Abnormal; Notable for the following:    Total CK 15692 (*)    CK, MB 35.5 (*)    All other components within normal limits  GRAM STAIN  CSF CULTURE  FUNGUS CULTURE W SMEAR  AMMONIA  LACTIC ACID, PLASMA  RAPID HIV SCREEN ([facility])  TSH  CBC  CREATININE, SERUM  PROTIME-INR  PHOSPHORUS  FERRITIN   Imaging Review Ct Head Wo Contrast  08/01/2013   *RADIOLOGY REPORT*  Clinical Data:  Altered mental status  CT HEAD WITHOUT CONTRAST  Technique:  Contiguous axial images were obtained from the base of the skull  through the vertex without contrast  Comparison:  None.  Findings:  The brain has a normal appearance without evidence for hemorrhage, acute infarction, hydrocephalus, or mass lesion.  There is no extra axial fluid collection.  The skull and paranasal sinuses are normal. Minor atherosclerotic changes of the intracranial vessels.  IMPRESSION:  No acute intracranial finding by noncontrast CT.   Original Report Authenticated By: Judie Petit. Shick, M.D.    MDM  No diagnosis found.   This is a 52 year old male who presents with altered level status and bilateral lower extremity weakness from the jail.  While nontoxic-appearing on exam, the patient was noted to be slow to respond. He is oriented x3. Neurologic exam is notable for 4/5 bilateral upper extremity strength and one out of 5 bilateral lower extremity strength. Patient has decreased rectal tone. Rectal temperature was noted to be 100.3.  AMS w/u initiated.  Neurology consulted to guide patient w/u.  Agree with LP.  Will hold abx at this time. INitial w/u unrevealing including head CT and labs.  Admitted  for further work-up.     Shon Baton, MD 08/01/13 Ernestina [place]

## 2013-08-01 NOTE — H&P (Signed)
Date: 08/01/2013               Patient Name:  [name] MRN: [id number]  DOB: [birth year] Age / Sex: 52 y.o., male   PCP: Pcp Not In System         Medical Service: Internal Medicine Teaching Service         Attending Physician: Dr. Shon Baton, MD    First Contact: Dr. Angelina Sheriff, MD Pager: [phone]  Second Contact: Dr. Kristie Cowman, MD Pager: [phone]       After Hours (After 5p/  First Contact Pager: [phone]  weekends / holidays): Second Contact Pager: [phone]   Chief Complaint: AMS  History of Present Illness: [name] is a 52 y.o. man with a pmhx of Bipolar d/o, HTN, COPD who presents from a correctional facility with a cc of AMS. Patient presents by EMS. Per EMS report the patient has been incarcerated since 10/1. He was evaluated by nurse practitioner today and found to be covered in urine and feces. He was unable to take anything by mouth. The patient was last seen normal two days ago. The patient reports that he he has had progressive weakness of the LE over the last 3 days. History is limited 2/2 short yes no responses by the patients. Denies numbness tingling, and changes in vision. He notes that he has not been given his medications (seroquel and cogentin) while in jail.   Meds: No current facility-administered medications for this encounter.   Current Outpatient Prescriptions  Medication Sig Dispense Refill  . acetaminophen (TYLENOL) 325 MG tablet Take 650 mg by mouth every 6 (six) hours as needed. For pain.       Marland Kitchen amLODipine (NORVASC) 10 MG tablet Take 10 mg by mouth daily.        Marland Kitchen aspirin 325 MG tablet Take 325 mg by mouth daily. Take 30 min prior to Niaspan.       . benazepril (LOTENSIN) 20 MG tablet Take 20 mg by mouth daily.        . benztropine (COGENTIN) 1 MG tablet Take 1 mg by mouth 2 (two) times daily.        . Cholecalciferol (VITAMIN D3) 1000 UNITS CAPS Take 1 capsule by mouth daily.        . divalproex (DEPAKOTE) 500 MG DR tablet Take  500 mg by mouth 2 (two) times daily.        Marland Kitchen docusate sodium (COLACE) 100 MG capsule Take 100 mg by mouth 2 (two) times daily.        Marland Kitchen docusate sodium 100 MG CAPS Take 200 mg by mouth 2 (two) times daily.  30 capsule  0  . ferrous sulfate 325 (65 FE) MG tablet Take 325 mg by mouth daily.        Marland Kitchen guaifenesin (TUSSIN) 100 MG/5ML syrup Take 200 mg by mouth 4 (four) times daily as needed. For congestion.       . haloperidol decanoate (HALDOL DECANOATE) 50 MG/ML injection Inject 75 mg into the muscle every 28 (twenty-eight) days.      . hydrochlorothiazide (HYDRODIURIL) 25 MG tablet Take 25 mg by mouth daily.        Marland Kitchen levothyroxine (SYNTHROID, LEVOTHROID) 75 MCG tablet Take 75 mcg by mouth daily.        Marland Kitchen loperamide (IMODIUM) 2 MG capsule Take 2 mg by mouth 4 (four) times daily as needed. For loose stool      . LORazepam (ATIVAN)  1 MG tablet Take 1 mg by mouth every 6 (six) hours as needed. For agitation      . magnesium hydroxide (MILK OF MAGNESIA) 400 MG/5ML suspension Take 30 mLs by mouth daily as needed. For constipation.       . meloxicam (MOBIC) 7.5 MG tablet Take 7.5 mg by mouth daily.      . Multiple Vitamins-Minerals (MULTIVITAMINS THER. W/MINERALS) TABS Take 1 tablet by mouth daily.        . niacin (NIASPAN) 750 MG CR tablet Take 750 mg by mouth daily.        Marland Kitchen omeprazole (PRILOSEC) 20 MG capsule Take 20 mg by mouth daily.        . polyethylene glycol (MIRALAX / GLYCOLAX) packet Take 17 g by mouth daily as needed.  14 each  0  . potassium chloride (K-DUR) 10 MEQ tablet Take 10 mEq by mouth daily.      . QUEtiapine (SEROQUEL) 200 MG tablet Take 1 tablet (200 mg total) by mouth 2 (two) times daily.  30 tablet  0  . senna (SENOKOT) 8.6 MG TABS Take 1 tablet (8.6 mg total) by mouth daily as needed.  30 each  0  . sertraline (ZOLOFT) 100 MG tablet Take 150 mg by mouth daily.        . simvastatin (ZOCOR) 20 MG tablet Take 20 mg by mouth at bedtime.        Marland Kitchen tobramycin-dexamethasone (TOBRADEX)  ophthalmic solution Place 1 drop into both eyes 4 (four) times daily.        Marland Kitchen zolpidem (AMBIEN) 10 MG tablet Take 5 mg by mouth at bedtime as needed. For sleep.        Allergies: Allergies as of 08/01/2013  . (No Known Allergies)   Past Medical History  Diagnosis Date  . Bipolar 1 disorder   . Hypertension   . Thyroid disease   . GERD (gastroesophageal reflux disease)   . Obstructive sleep apnea   . COPD (chronic obstructive pulmonary disease)    History reviewed. No pertinent past surgical history. Family History  Problem Relation Age of Onset  . Cirrhosis Mother   . Hypertension Mother   . Diabetes Mellitus II Mother    History   Social History  . Marital Status: Single    Spouse Name: N/A    Number of Children: N/A  . Years of Education: N/A   Occupational History  . Not on file.   Social History Main Topics  . Smoking status: Current Every Day Smoker -- 0.50 packs/day    Types: Cigarettes  . Smokeless tobacco: Not on file  . Alcohol Use: Yes     Comment: about 2 beers every day.   . Drug Use: No  . Sexual Activity: Yes   Other Topics Concern  . Not on file   Social History Narrative  . No narrative on file    Review of Systems: Review of systems not obtained due to patient factors.  Physical Exam: Blood pressure 131/93, pulse 85, temperature 100.3 F (37.9 C), temperature source Rectal, resp. rate 27, SpO2 99.00%. Physical Exam  Constitutional: He appears well-developed and well-nourished.  HENT:  Mouth/Throat: Oropharynx is clear and moist. No oropharyngeal exudate.  Eyes: EOM are normal. Pupils are equal, round, and reactive to light.  Cardiovascular: Normal rate, regular rhythm and normal heart sounds.   Pulmonary/Chest: Effort normal. No respiratory distress. He has no wheezes. He has rales.  Abdominal: Soft. Bowel sounds are normal.  Musculoskeletal: He exhibits no edema and no tenderness.  Neurological:  Mental status poor. Unable to  respond to most questions. Able to answer yes and no to direct simple questions.  Diffusely rigid. Able to move all extremities but very slowly. Cogwheel rigidity present. Hyperreflexic in bilateral BR. Intact to sensation bil.   Skin: Skin is warm. He is not diaphoretic.  Psychiatric:  Muted affect and mood.     Lab results: Basic Metabolic Panel:  Recent Labs  56/21/30 1402  NA 136  K 3.4*  CL 95*  CO2 25  GLUCOSE 113*  BUN 62*  CREATININE 1.73*  CALCIUM 10.2  Gap: 16  Liver Function Tests:  Recent Labs  08/01/13 1402  AST 283*  ALT 86*  ALKPHOS 81  BILITOT 0.2*  PROT 8.9*  ALBUMIN 4.5   No results found for this basename: LIPASE, AMYLASE,  in the last 72 hours  Recent Labs  08/01/13 1402  AMMONIA 40   CBC:  Recent Labs  08/01/13 1402  WBC 11.4*  NEUTROABS 8.3*  HGB 14.3  HCT 40.9  MCV 86.7  PLT 205   Cardiac Enzymes: No results found for this basename: CKTOTAL, CKMB, CKMBINDEX, TROPONINI,  in the last 72 hours BNP: No results found for this basename: PROBNP,  in the last 72 hours D-Dimer: No results found for this basename: DDIMER,  in the last 72 hours CBG: No results found for this basename: GLUCAP,  in the last 72 hours Hemoglobin A1C: No results found for this basename: HGBA1C,  in the last 72 hours Fasting Lipid Panel: No results found for this basename: CHOL, HDL, LDLCALC, TRIG, CHOLHDL, LDLDIRECT,  in the last 72 hours Thyroid Function Tests: No results found for this basename: TSH, T4TOTAL, FREET4, T3FREE, THYROIDAB,  in the last 72 hours Anemia Panel: No results found for this basename: VITAMINB12, FOLATE, FERRITIN, TIBC, IRON, RETICCTPCT,  in the last 72 hours Coagulation: No results found for this basename: LABPROT, INR,  in the last 72 hours Urine Drug Screen: Drugs of Abuse     Component Value Date/Time   LABOPIA NONE DETECTED 08/01/2013 1458   COCAINSCRNUR NONE DETECTED 08/01/2013 1458   LABBENZ POSITIVE* 08/01/2013 1458    AMPHETMU NONE DETECTED 08/01/2013 1458   THCU NONE DETECTED 08/01/2013 1458   LABBARB NONE DETECTED 08/01/2013 1458    Alcohol Level: No results found for this basename: ETH,  in the last 72 hours Urinalysis:  Recent Labs  08/01/13 1458  COLORURINE YELLOW  LABSPEC 1.020  PHURINE 5.0  GLUCOSEU NEGATIVE  HGBUR MODERATE*  BILIRUBINUR NEGATIVE  KETONESUR NEGATIVE  PROTEINUR >300*  UROBILINOGEN 0.2  NITRITE NEGATIVE  LEUKOCYTESUR NEGATIVE  No red cells  Misc. Labs:  CSF: Appears red, Xanthochromic, 86578 RBC, 4 WBC Gram Stain: No Organism CSF Culture: Pending Fungal Stain: Pending Protein: 64 Glucose: 93  Imaging results:  Ct Head Wo Contrast  08/01/2013   *RADIOLOGY REPORT*  Clinical Data:  Altered mental status  CT HEAD WITHOUT CONTRAST  Technique:  Contiguous axial images were obtained from the base of the skull through the vertex without contrast  Comparison:  None.  Findings:  The brain has a normal appearance without evidence for hemorrhage, acute infarction, hydrocephalus, or mass lesion.  There is no extra axial fluid collection.  The skull and paranasal sinuses are normal. Minor atherosclerotic changes of the intracranial vessels.  IMPRESSION:  No acute intracranial finding by noncontrast CT.   Original Report Authenticated By: Judie Petit. Miles Costain, M.D.  Assessment & Plan by Problem: Principal Problem:   Neuroleptic malignant syndrome Active Problems:   Rhabdomyolysis   HTN (hypertension)   Bipolar 1 disorder  Altered Mental Status with muscle rigidity:  His clinical picture is concerning for neuroleptic malignant syndrome which usually is a rare reaction to neuroleptic medications but can also occur with abrupt cessation of dopaminergic medication such as benztropine. [name] confirmed that he had not been given his benztropin for at least 3 days. He exhibits muscle rigidity and has decreased level of consciousness. His blood pressure is elevated and he is tachypneic which may  indicate some autonomic dysregulation. He also has leukocytosis, incontinence, elevated CK, and metabolic acidosis which support the diagnosis of NMS. Other potential causes could include aseptic meningitis, cocaine toxicity , SSRI toxicity , stroke(negative UDS , normal CT head, elevated CK, LFTs, and leukocytosis don't occur in serotonin syndrome). No reports of other medications that are difficult to distinguish from NMS including no metoclopramide, prochlorperazine, promethazine or droperidol which can cause decreased D2 receptor activation. No report of recent increase in his other antipsychotics which could have a neuroleptic-induced reaction  -will continue with supportive therapy and close monitoring -utilize cooling blankets if hyperthermia evident -resume dopaminergic drugs (benztropine) -cont Ativan -appreciate Neurology recommendations -monitor in SDU - Likely consult psych in AM - Plan to obtain psych records (patient currently unable to say where he is prescribes his medications)  Rhabdomyolysis: in setting of prolonged immobilization; Causative factors for immobilization are unclear at this time given limited history. Neuroleptic Malignant Syndrome could account for his elevated CK secondary to hyperthermia if he experienced  more severely elvated temp before hospitalization. Given that pt was incarcerated for DUI excess alcohol may play a role, no evidence of trauma or crush injury. Infections may lead to a small percentage of adult rhabdomyolysis. Pt is without severe electrolyte disturbances including no hyperkalemia or hypocalemia. HIV negative and lumbar puncture thus far unrevealing. Bacterial infections may cause rhabdo thus will follow blood and urine cultures. Legionella classically associated with rhado in adults but [name] does not appear septic.  -Will trend CK q6-12h for peak level -continue to monitor for electrolyte abnormalities K, Ca -check phosphorus as  hypophoshatemia may cause rhabdo secondary to shortage of phosphate for ATP (3.2) -check TSH for uncontrolled hypothyroidism as pt has been without levothyroxine (6.1) -follow LP testing, BCX and UCx  Acute Kidney Injury:  likely secondary to rhabdomyolysis with creatinine level 1.73 (baseline creatinine .9-1.1). Will require aggressive IV hydration as noted above to prevent pigment associated renal failure. Would also consider urine alkalinization given CK level >6K which increases risk for precipitation of myoglobin and hemoglobin in the tubules. -normal saline with 1 amp NaHCO3 to alkalinize urine with goal urine pH 6.5-7.0, urine pH currently rather acidic at 5 -monitor Ca (NaHCO3 potentiates hypocalcemia) -monitor I/Os with consideration for Lasix to enhance UOP if becomes oliguric  # Elevated Transaminases Could be 2/2 to chronic alcohol use as AST>ALT and patient heavy alcohol consumption daily, but likely also due to rhabdomyolysis as this can cause minor elevations of LFTs. - Trend LFTs  # Bipolar I - Hold Depakote 500 mg BID - Hold Seroquel 200 mg BID - Of note, patient has long acting Haldol injection.(not continued during admission)  # Alcohol Abuse - CIWA held as patient is outside of w/d window (jail since 10/1)  Dispo: Disposition is deferred at this time, awaiting improvement of current medical problems. Anticipated discharge in approximately 2-3 day(s).  The patient does not have a current PCP (Pcp Not In System) and does need an [facility] hospital follow-up appointment after discharge.  The patient does not know have transportation limitations that hinder transportation to clinic appointments.  Signed: Pleas Koch, MD 08/01/2013, 5:38 PM   Attending addendum: [name] is on history of bipolar disorder. He resides in an assisted living facility. He has been receiving monthly injections of haloperidol and had his last dose on September 24. He was incarcerated on  October 1 after being charged with larceny. His chronic oral medications consisting of Cogentin, Seroquel, Zoloft and Ambien were stopped. Yesterday he was found to be confused and lethargic covered in urine and feces. On presentation he was found to have low-grade fever and diffuse rigidity of his extremities with hyperreflexia. He was also found to have rhabdomyolysis and renal insufficiency. He states that he feels better today. He knows that he is at [facility]. He continues to have diffuse rigidity with some cogwheeling but he is afebrile now, his CK enzyme level is decreasing and his creatinine is back to normal after hydration overnight. I suspect that he may have early and relatively mild neuroleptic malignant syndrome. Obviously, we do not have the option of stopping his haloperidol. We have requested a psychiatric evaluation and will discuss the potential benefit of dantrolene or bromocriptine therapy if his rigidity persists.  Cliffton Asters, MD [facility] [phone] pager   [phone] cell 08/02/2013, 12:48 PM

## 2013-08-01 NOTE — Progress Notes (Signed)
Called by charge RN regarding orders for vital signs every 2 hours. If patient requires closer monitoring than routine vitals then needs to be transferred to a higher level of care. Advised bedside RN to call MD regarding vital signs. Orders received to transfer patient to stepdown.

## 2013-08-01 NOTE — Progress Notes (Addendum)
Spoke with Aundria Rud MD from [facility] regarding patient's orders for Q2 vital signs. [place] RN from [facility] assessed patient and per RN believes pt needs to be transferred to stepdown for closer monitoring. Pt is very lethargic and confused, unable to follow commands or swallow medications. Rogers MD paged again to change medication to IV. Awaiting call back.  Filed Vitals:   08/01/13 2023  BP: 157/118  Pulse: 85  Temp: 98.6 F (37 C)  Resp:     New orders placed to transfer pt to a step down. Will continue to monitor patient. Gilman Schmidt

## 2013-08-01 NOTE — Progress Notes (Signed)
CRITICAL VALUE ALERT  Critical value received:  CK, MB 35.5   Date of notification:  08/01/13   Time of notification: 19:01  Critical value read back:yes  Nurse who received alert:  Dianna Limbo  MD notified (1st page):  [phone])  Time of first page:  19:01   MD notified (2nd page):  Time of second page:  Responding MD:  Danise Edge ([phone])  Time MD responded: 19:02

## 2013-08-01 NOTE — ED Notes (Signed)
Patient transported to CT 

## 2013-08-01 NOTE — Consult Note (Signed)
NEURO HOSPITALIST CONSULT NOTE    Reason for Consult: AMS and LE weakness  HPI:                                                                                                                                          [name] is an 52 y.o. male history of bipolar disorder, hypertension, and thyroid disease who presents from jail with altered mental status and bilateral lower extremity weakness. Per notes patient was last seen normal about 2 days ago. Patient states about three days ago he was his normal self.  Since then he has become weak but cannot give me any further detailed history. He denies having a flu or any further sickness. He denies any numbness, tingling, or decreased vision.  On consultation he is very slow to respond and answers most of my questions with yes or no answers.  He appears uncomfortable and slightly SOB (although he denies being SOB), He is laying in the bed with head held still and stiff like appearance. HE currently has a Temp of 100.3.  In addition patient has bipolar disorder and is on Cogentin and Seroquel which he states he has not been given while in the jail.  He states he has not had episodes like this before but if he does not get his medications he does break out ina sweat.   Past Medical History  Diagnosis Date  . Bipolar 1 disorder   . Hypertension   . Thyroid disease   . GERD (gastroesophageal reflux disease)   . Obstructive sleep apnea     History reviewed. No pertinent past surgical history.  Family History  Problem Relation Age of Onset  . Cirrhosis Mother   . Hypertension Mother   . Diabetes Mellitus II Mother     Social History:  reports that he has been smoking Cigarettes.  He has been smoking about 0.50 packs per day. He does not have any smokeless tobacco history on file. He reports that he drinks alcohol. He reports that he does not use illicit drugs.  No Known Allergies  MEDICATIONS:  No current facility-administered medications for this encounter.   Current Outpatient Prescriptions  Medication Sig Dispense Refill  . acetaminophen (TYLENOL) 325 MG tablet Take 650 mg by mouth every 6 (six) hours as needed. For pain.       Marland Kitchen amLODipine (NORVASC) 10 MG tablet Take 10 mg by mouth daily.        Marland Kitchen aspirin 325 MG tablet Take 325 mg by mouth daily. Take 30 min prior to Niaspan.       . benazepril (LOTENSIN) 20 MG tablet Take 20 mg by mouth daily.        . benztropine (COGENTIN) 1 MG tablet Take 1 mg by mouth 2 (two) times daily.        . Cholecalciferol (VITAMIN D3) 1000 UNITS CAPS Take 1 capsule by mouth daily.        . divalproex (DEPAKOTE) 500 MG DR tablet Take 500 mg by mouth 2 (two) times daily.        Marland Kitchen docusate sodium (COLACE) 100 MG capsule Take 100 mg by mouth 2 (two) times daily.        Marland Kitchen docusate sodium 100 MG CAPS Take 200 mg by mouth 2 (two) times daily.  30 capsule  0  . ferrous sulfate 325 (65 FE) MG tablet Take 325 mg by mouth daily.        Marland Kitchen guaifenesin (TUSSIN) 100 MG/5ML syrup Take 200 mg by mouth 4 (four) times daily as needed. For congestion.       . haloperidol decanoate (HALDOL DECANOATE) 50 MG/ML injection Inject 75 mg into the muscle every 28 (twenty-eight) days.      . hydrochlorothiazide (HYDRODIURIL) 25 MG tablet Take 25 mg by mouth daily.        Marland Kitchen levothyroxine (SYNTHROID, LEVOTHROID) 75 MCG tablet Take 75 mcg by mouth daily.        Marland Kitchen loperamide (IMODIUM) 2 MG capsule Take 2 mg by mouth 4 (four) times daily as needed. For loose stool      . LORazepam (ATIVAN) 1 MG tablet Take 1 mg by mouth every 6 (six) hours as needed. For agitation      . magnesium hydroxide (MILK OF MAGNESIA) 400 MG/5ML suspension Take 30 mLs by mouth daily as needed. For constipation.       . meloxicam (MOBIC) 7.5 MG tablet Take 7.5 mg by mouth daily.      . Multiple Vitamins-Minerals  (MULTIVITAMINS THER. W/MINERALS) TABS Take 1 tablet by mouth daily.        . niacin (NIASPAN) 750 MG CR tablet Take 750 mg by mouth daily.        Marland Kitchen omeprazole (PRILOSEC) 20 MG capsule Take 20 mg by mouth daily.        . polyethylene glycol (MIRALAX / GLYCOLAX) packet Take 17 g by mouth daily as needed.  14 each  0  . potassium chloride (K-DUR) 10 MEQ tablet Take 10 mEq by mouth daily.      . QUEtiapine (SEROQUEL) 200 MG tablet Take 1 tablet (200 mg total) by mouth 2 (two) times daily.  30 tablet  0  . senna (SENOKOT) 8.6 MG TABS Take 1 tablet (8.6 mg total) by mouth daily as needed.  30 each  0  . sertraline (ZOLOFT) 100 MG tablet Take 150 mg by mouth daily.        . simvastatin (ZOCOR) 20 MG tablet Take 20 mg by mouth at bedtime.        Marland Kitchen tobramycin-dexamethasone (TOBRADEX)  ophthalmic solution Place 1 drop into both eyes 4 (four) times daily.        Marland Kitchen zolpidem (AMBIEN) 10 MG tablet Take 5 mg by mouth at bedtime as needed. For sleep.          ROS:                                                                                                                                       History obtained from the patient  General ROS: negative for - chills, fatigue, fever, night sweats, weight gain or weight loss Psychological ROS: negative for - behavioral disorder, hallucinations, memory difficulties, mood swings or suicidal ideation Ophthalmic ROS: negative for - blurry vision, double vision, eye pain or loss of vision ENT ROS: negative for - epistaxis, nasal discharge, oral lesions, sore throat, tinnitus or vertigo Allergy and Immunology ROS: negative for - hives or itchy/watery eyes Hematological and Lymphatic ROS: negative for - bleeding problems, bruising or swollen lymph nodes Endocrine ROS: negative for - galactorrhea, hair pattern changes, polydipsia/polyuria or temperature intolerance Respiratory ROS: negative for - cough, hemoptysis, shortness of breath or wheezing Cardiovascular ROS:  negative for - chest pain, dyspnea on exertion, edema or irregular heartbeat Gastrointestinal ROS: negative for - abdominal pain, diarrhea, hematemesis, nausea/vomiting or stool incontinence Genito-Urinary ROS: negative for - dysuria, hematuria, incontinence or urinary frequency/urgency Musculoskeletal ROS: negative for - joint swelling or muscular weakness Neurological ROS: as noted in HPI Dermatological ROS: negative for rash and skin lesion changes   Blood pressure 131/93, pulse 85, temperature 100.3 F (37.9 C), temperature source Rectal, resp. rate 27, SpO2 99.00%.   Neurologic Examination:                                                                                                      Mental Status: Alert, oriented to palce, thought content slow.  Speech minimal but shows fluent without evidence of aphasia.  Able to follow simple commands without difficulty. Cranial Nerves: II: Discs flat bilaterally; Visual fields grossly normal, pupils equal, round, reactive to light and accommodation III,IV, VI: ptosis not present, extra-ocular motions intact bilaterally V,VII: smile symmetric, facial light touch sensation normal bilaterally VIII: hearing normal bilaterally IX,X: gag reflex present XI: bilateral shoulder shrug XII: midline tongue extension Motor: 4-/5 throughout Sensory: Pinprick and light touch intact throughout, bilaterally Deep Tendon Reflexes:  Right: Upper Extremity   Left: Upper extremity   biceps (C-5 to C-6) 2/4   biceps (C-5 to C-6)  2/4 tricep (C7) 2/4    triceps (C7) 2/4 Brachioradialis (C6) 2/4  Brachioradialis (C6) 2/4  Lower Extremity Lower Extremity  quadriceps (L-2 to L-4) 2/4   quadriceps (L-2 to L-4) 2/4 Achilles (S1) 2/4   Achilles (S1) 2/4  Plantars: Right: downgoing   Left: downgoing Cerebellar: normal finger-to-nose,  normal heel-to-shin test Gait: not tested.  CV: pulses palpable throughout    No results found for this basename: cbc,  bmp, coags, chol, tri, ldl, hga1c    Results for orders placed during the hospital encounter of 08/01/13 (from the past 48 hour(s))  AMMONIA     Status: None   Collection Time    08/01/13  2:02 PM      Result Value Range   Ammonia 40  11 - 60 umol/L  CBC WITH DIFFERENTIAL     Status: Abnormal   Collection Time    08/01/13  2:02 PM      Result Value Range   WBC 11.4 (*) 4.0 - 10.5 K/uL   RBC 4.72  4.22 - 5.81 MIL/uL   Hemoglobin 14.3  13.0 - 17.0 g/dL   HCT 16.1  09.6 - 04.5 %   MCV 86.7  78.0 - 100.0 fL   MCH 30.3  26.0 - 34.0 pg   MCHC 35.0  30.0 - 36.0 g/dL   RDW 40.9  81.1 - 91.4 %   Platelets 205  150 - 400 K/uL   Neutrophils Relative % 72  43 - 77 %   Neutro Abs 8.3 (*) 1.7 - 7.7 K/uL   Lymphocytes Relative 12  12 - 46 %   Lymphs Abs 1.4  0.7 - 4.0 K/uL   Monocytes Relative 15 (*) 3 - 12 %   Monocytes Absolute 1.7 (*) 0.1 - 1.0 K/uL   Eosinophils Relative 0  0 - 5 %   Eosinophils Absolute 0.0  0.0 - 0.7 K/uL   Basophils Relative 0  0 - 1 %   Basophils Absolute 0.0  0.0 - 0.1 K/uL  COMPREHENSIVE METABOLIC PANEL     Status: Abnormal   Collection Time    08/01/13  2:02 PM      Result Value Range   Sodium 136  135 - 145 mEq/L   Potassium 3.4 (*) 3.5 - 5.1 mEq/L   Chloride 95 (*) 96 - 112 mEq/L   CO2 25  19 - 32 mEq/L   Glucose, Bld 113 (*) 70 - 99 mg/dL   BUN 62 (*) 6 - 23 mg/dL   Creatinine, Ser 7.82 (*) 0.50 - 1.35 mg/dL   Calcium 95.6  8.4 - 21.3 mg/dL   Total Protein 8.9 (*) 6.0 - 8.3 g/dL   Albumin 4.5  3.5 - 5.2 g/dL   AST 086 (*) 0 - 37 U/L   ALT 86 (*) 0 - 53 U/L   Alkaline Phosphatase 81  39 - 117 U/L   Total Bilirubin 0.2 (*) 0.3 - 1.2 mg/dL   GFR calc non Af Amer 44 (*) >90 mL/min   GFR calc Af Amer 51 (*) >90 mL/min   Comment: (NOTE)     The eGFR has been calculated using the CKD EPI equation.     This calculation has not been validated in all clinical situations.     eGFR's persistently <90 mL/min signify possible Chronic Kidney     Disease.   LACTIC ACID, PLASMA     Status: None   Collection Time    08/01/13  2:03 PM  Result Value Range   Lactic Acid, Venous 1.6  0.5 - 2.2 mmol/L    No results found.   Assessment/Plan: 52 yo male with 2-3 day history of confusion and recent incontinence.  On exam patient shows no focal localizing or lateralizing symptoms but is diffusely weak 4-/5 throughtout, increased neck stiffness, WBC 11.4, Temp 100.3 and low grade fever. Etiology is unclear but must consider CNS infection. ED will perform LP.  TSH is pending.   Recommend: 1) LP ((done and awaiting results) 2) TSH 3) Agree with labs 4) Restart home medications for bipolar disorder 5) Evaluate for underlying infection  Assessment and plan discussed with with attending physician and they are in agreement.    Felicie Morn PA-C [facility] [phone]  08/01/2013, 4:12 PM  Patient seen and examined together with physician assistant and I concur with the assessment and plan.  Wyatt Portela, MD

## 2013-08-01 NOTE — ED Notes (Addendum)
Per EMS: Pt picked up from [facility], has been there since 10/1. Nurse practitioner seeing him there reported he was found today covered in urine. Unable to direct food or drink to mouth, stated "he just pours it on himself." Per EMS, NP stated LSN was 2 days ago. Yesterday observed to have AMS, unable to sit on own without support, incontinent of urine. NP concerned about possible stroke. Per EMS, possible narcotic, alcohol withdrawal. EMS states he passed neuro/stroke screen. A&O x 3. EMS VS: BP 133/102, HR 88, RR 20, SpO2 100% CBG 168

## 2013-08-02 DIAGNOSIS — F259 Schizoaffective disorder, unspecified: Secondary | ICD-10-CM

## 2013-08-02 DIAGNOSIS — R4182 Altered mental status, unspecified: Secondary | ICD-10-CM

## 2013-08-02 DIAGNOSIS — T43505A Adverse effect of unspecified antipsychotics and neuroleptics, initial encounter: Secondary | ICD-10-CM

## 2013-08-02 DIAGNOSIS — M6282 Rhabdomyolysis: Secondary | ICD-10-CM

## 2013-08-02 DIAGNOSIS — F319 Bipolar disorder, unspecified: Secondary | ICD-10-CM

## 2013-08-02 LAB — COMPREHENSIVE METABOLIC PANEL
ALT: 74 U/L — ABNORMAL HIGH (ref 0–53)
AST: 227 U/L — ABNORMAL HIGH (ref 0–37)
Albumin: 3.6 g/dL (ref 3.5–5.2)
Alkaline Phosphatase: 68 U/L (ref 39–117)
CO2: 25 mEq/L (ref 19–32)
Calcium: 9.2 mg/dL (ref 8.4–10.5)
Chloride: 104 mEq/L (ref 96–112)
Creatinine, Ser: 1.28 mg/dL (ref 0.50–1.35)
GFR calc Af Amer: 73 mL/min — ABNORMAL LOW (ref >90)
GFR calc non Af Amer: 63 mL/min — ABNORMAL LOW (ref >90)
Glucose, Bld: 104 mg/dL — ABNORMAL HIGH (ref 70–99)
Potassium: 3.2 mEq/L — ABNORMAL LOW (ref 3.5–5.1)
Sodium: 142 mEq/L (ref 135–145)
Total Bilirubin: 0.3 mg/dL (ref 0.3–1.2)

## 2013-08-02 LAB — FERRITIN: Ferritin: 294 ng/mL (ref 22–322)

## 2013-08-02 LAB — CBC
HCT: 36 % — ABNORMAL LOW (ref 39.0–52.0)
MCH: 29.4 pg (ref 26.0–34.0)
MCHC: 33.6 g/dL (ref 30.0–36.0)
MCV: 87.4 fL (ref 78.0–100.0)
Platelets: 186 10*3/uL (ref 150–400)
RBC: 4.12 MIL/uL — ABNORMAL LOW (ref 4.22–5.81)
RDW: 14.3 % (ref 11.5–15.5)
WBC: 7.6 10*3/uL (ref 4.0–10.5)

## 2013-08-02 LAB — CK TOTAL AND CKMB (NOT AT ARMC)
CK, MB: 10.7 ng/mL (ref 0.3–4.0)
Total CK: 8655 U/L — ABNORMAL HIGH (ref 7–232)

## 2013-08-02 LAB — MRSA PCR SCREENING: MRSA by PCR: NEGATIVE

## 2013-08-02 LAB — TROPONIN I: Troponin I: <0.3 ng/mL (ref <0.30)

## 2013-08-02 MED ORDER — WHITE PETROLATUM GEL
Status: AC
  Administered 2013-08-02: 0.2
  Filled 2013-08-02: qty 5

## 2013-08-02 MED ORDER — LORAZEPAM 2 MG/ML IJ SOLN
1.0000 mg | Freq: Three times a day (TID) | INTRAMUSCULAR | Status: DC
  Administered 2013-08-02 – 2013-08-05 (×7): 1 mg via INTRAVENOUS
  Filled 2013-08-02 (×7): qty 1

## 2013-08-02 MED ORDER — METOPROLOL TARTRATE 25 MG PO TABS
25.0000 mg | ORAL_TABLET | Freq: Two times a day (BID) | ORAL | Status: DC
  Administered 2013-08-02 – 2013-08-06 (×8): 25 mg via ORAL
  Filled 2013-08-02 (×9): qty 1

## 2013-08-02 MED ORDER — HYDRALAZINE HCL 20 MG/ML IJ SOLN: 5.0000 mg | INTRAMUSCULAR | Status: DC | PRN

## 2013-08-02 MED ORDER — CHLORHEXIDINE GLUCONATE 0.12 % MT SOLN
15.0000 mL | Freq: Two times a day (BID) | OROMUCOSAL | Status: DC
  Administered 2013-08-02 – 2013-08-04 (×5): 15 mL via OROMUCOSAL
  Filled 2013-08-02 (×9): qty 15

## 2013-08-02 MED ORDER — AMLODIPINE BESYLATE 10 MG PO TABS
10.0000 mg | ORAL_TABLET | Freq: Every day | ORAL | Status: DC
  Administered 2013-08-02 – 2013-08-06 (×5): 10 mg via ORAL
  Filled 2013-08-02 (×6): qty 1

## 2013-08-02 MED ORDER — BIOTENE DRY MOUTH MT LIQD
15.0000 mL | Freq: Two times a day (BID) | OROMUCOSAL | Status: DC
  Administered 2013-08-02 – 2013-08-04 (×5): 15 mL via OROMUCOSAL

## 2013-08-02 MED ORDER — LORAZEPAM 2 MG/ML IJ SOLN
0.5000 mg | Freq: Once | INTRAMUSCULAR | Status: AC
  Administered 2013-08-02: 0.5 mg via INTRAVENOUS
  Filled 2013-08-02: qty 1

## 2013-08-02 MED ORDER — POTASSIUM CHLORIDE 10 MEQ/100ML IV SOLN
10.0000 meq | INTRAVENOUS | Status: AC
  Administered 2013-08-02 (×4): 10 meq via INTRAVENOUS
  Filled 2013-08-02 (×4): qty 100

## 2013-08-02 NOTE — Clinical Social Work Psychosocial (Signed)
Clinical Social Work Department BRIEF PSYCHOSOCIAL ASSESSMENT 08/02/2013  Patient:  [name],[name]     Account Number:  [id number]     Admit date:  08/01/2013  Clinical Social Worker:  Varney Biles  Date/Time:  08/02/2013 03:41 PM  Referred by:  Physician  Date Referred:  08/02/2013 Referred for  ALF Placement   Other Referral:   Interview type:  Patient Other interview type:    PSYCHOSOCIAL DATA Living Status:  FACILITY Admitted from facility:  [facility] Level of care:  Assisted Living Primary support name:  Ladarrell Cornwall Primary support relationship to patient:  SIBLING Degree of support available:   Good--pt says he has support from sister and friends. Pt says he is not married and does not have any children.    CURRENT CONCERNS Current Concerns  Post-Acute Placement   Other Concerns:    SOCIAL WORK ASSESSMENT / PLAN Pt says he is from [facility]. CSW confirmed this with facility. Pt says he wishes to return to this ALF when medically ready for discharge. Pt is not married and does not have children. Pt says he has friends that provide support, and pt has a sister named Azarius Lambson.   Assessment/plan status:  Psychosocial Support/Ongoing Assessment of Needs Other assessment/ plan:   Information/referral to community resources:   ALF [facility]    PATIENT'S/FAMILY'S RESPONSE TO PLAN OF CARE: Pt provided short yes/no answers to CSW questions.      Maryclare Labrador, MSW, [facility] Clinical Social Worker [phone]

## 2013-08-02 NOTE — Progress Notes (Signed)
Pt has an order for a swallow eval, but pt has been lethargic and confused and swallow eval. can not be safely done at this time, will pass it off to next shift for it to be done when pt is more alert.-----Rance Smithson, rn

## 2013-08-02 NOTE — Progress Notes (Signed)
Subjective and key lab:  - Patient feel better. Tm=100.3. Interactive and answered questions. He is oriented to the place, but not to the time and person.  - CK level trending down:  15.6 K-->8.6 K - K=3.2-->repleded. - Cre: 1.73-->1.49-->1.28. - trop negative x 3.   Objective: Vital signs in last 24 hours: Filed Vitals:   08/02/13 1200 08/02/13 1215 08/02/13 1220 08/02/13 1230  BP: 177/113 174/120 172/108 168/116  Pulse:      Temp:      TempSrc:      Resp:      Height:      Weight:      SpO2:       Weight change:   Intake/Output Summary (Last 24 hours) at 08/02/13 1341 Last data filed at 08/02/13 0817  Gross per 24 hour  Intake      0 ml  Output   1250 ml  Net  -1250 ml   Physical Exam:   Filed Vitals:   08/02/13 1215 08/02/13 1220 08/02/13 1230 08/02/13 1300  BP: 174/120 172/108 168/116 174/117  Pulse:      Temp:      TempSrc:      Resp:      Height:      Weight:      SpO2:        General: Patient is oriented only to place, answered some symple questions.  HEENT: PERRL, EOMI, no scleral icterus, No JVD or bruit Cardiac: S1/S2, RRR, No murmurs, gallops or rubs Pulm: Good air movement bilaterally. Clear to auscultation bilaterally. No rales, wheezing, rhonchi or rubs. Abd: Soft, nondistended, nontender, no rebound pain, no organomegaly, BS present Ext: No edema. 2+DP/PT pulse bilaterally Musculoskeletal: No joint deformities, erythema, or stiffness, ROM full Skin: No rashes.  Neuro: Oriented to place, but not to time and person. Diffusely rigid. Able to move all extremities slowly. Negative Babinski's sign.  3+ brachial reflex bilaterally. Negative Babinski's sign.  Lab Results: Basic Metabolic Panel:  Recent Labs Lab 08/01/13 1402 08/01/13 1825 08/01/13 1930 08/02/13 0345  NA 136  --   --  142  K 3.4*  --   --  3.2*  CL 95*  --   --  104  CO2 25  --   --  25  GLUCOSE 113*  --   --  104*  BUN 62*  --   --  48*  CREATININE 1.73* 1.49*  --  1.28   CALCIUM 10.2  --   --  9.2  PHOS  --   --  3.2  --    Liver Function Tests:  Recent Labs Lab 08/01/13 1402 08/02/13 0345  AST 283* 227*  ALT 86* 74*  ALKPHOS 81 68  BILITOT 0.2* 0.3  PROT 8.9* 7.4  ALBUMIN 4.5 3.6   No results found for this basename: LIPASE, AMYLASE,  in the last 168 hours  Recent Labs Lab 08/01/13 1402  AMMONIA 40   CBC:  Recent Labs Lab 08/01/13 1402 08/01/13 1825 08/02/13 0345  WBC 11.4* 10.5 7.6  NEUTROABS 8.3*  --   --   HGB 14.3 12.3* 12.1*  HCT 40.9 37.2* 36.0*  MCV 86.7 87.5 87.4  PLT 205 188 186   Cardiac Enzymes:  Recent Labs Lab 08/01/13 1402 08/01/13 1915 08/02/13 0345 08/02/13 1049  CKTOTAL 16109*  --   --  8655*  CKMB 35.5*  --   --  10.7*  TROPONINI  --  <0.30 <0.30 <0.30   BNP: No  results found for this basename: PROBNP,  in the last 168 hours D-Dimer: No results found for this basename: DDIMER,  in the last 168 hours CBG:  Recent Labs Lab 08/02/13 0919  GLUCAP 106*   Hemoglobin A1C: No results found for this basename: HGBA1C,  in the last 168 hours Fasting Lipid Panel: No results found for this basename: CHOL, HDL, LDLCALC, TRIG, CHOLHDL, LDLDIRECT,  in the last 168 hours Thyroid Function Tests:  Recent Labs Lab 08/01/13 1638  TSH 6.108*   Coagulation:  Recent Labs Lab 08/01/13 1930  LABPROT 15.3*  INR 1.24   Anemia Panel:  Recent Labs Lab 08/01/13 1930  FERRITIN 294   Urine Drug Screen: Drugs of Abuse     Component Value Date/Time   LABOPIA NONE DETECTED 08/01/2013 1458   COCAINSCRNUR NONE DETECTED 08/01/2013 1458   LABBENZ POSITIVE* 08/01/2013 1458   AMPHETMU NONE DETECTED 08/01/2013 1458   THCU NONE DETECTED 08/01/2013 1458   LABBARB NONE DETECTED 08/01/2013 1458    Alcohol Level: No results found for this basename: ETH,  in the last 168 hours Urinalysis:  Recent Labs Lab 08/01/13 1458  COLORURINE YELLOW  LABSPEC 1.020  PHURINE 5.0  GLUCOSEU NEGATIVE  HGBUR MODERATE*   BILIRUBINUR NEGATIVE  KETONESUR NEGATIVE  PROTEINUR >300*  UROBILINOGEN 0.2  NITRITE NEGATIVE  LEUKOCYTESUR NEGATIVE   Misc. Labs:  Micro Results: Recent Results (from the past 240 hour(s))  GRAM STAIN     Status: None   Collection Time    08/01/13  5:17 PM      Result Value Range Status   Specimen Description CSF   Final   Special Requests 0.6ML FLUID   Final   Gram Stain     Final   Value: CYTOSPIN PREP     WBC PRESENT,BOTH PMN AND MONONUCLEAR     NO ORGANISMS SEEN     Gram Stain Report Called to,Read Back By and Verified With: RN LARSON, A 1822 08/01/13 Riki Rusk.   Report Status 08/01/2013 FINAL   Final  CSF CULTURE     Status: None   Collection Time    08/01/13  5:17 PM      Result Value Range Status   Specimen Description CSF   Final   Special Requests 0.6ML FLUID   Final   Gram Stain     Final   Value: WBC PRESENT,BOTH PMN AND MONONUCLEAR     NO ORGANISMS SEEN     CYTOSPIN Performed at [facility]     Performed at [facility]   Culture     Final   Value: NO GROWTH 1 DAY     Note: Gram Stain Report Called to,Read Back By and Verified With: RN Braulio Conte 08/01/13 Riki Rusk     Performed at [facility]   Report Status PENDING   Incomplete  FUNGUS CULTURE W SMEAR     Status: None   Collection Time    08/01/13  5:17 PM      Result Value Range Status   Specimen Description CSF   Final   Special Requests 0.6ML FLUID   Final   Fungal Smear     Final   Value: NO YEAST OR FUNGAL ELEMENTS SEEN     Performed at [facility]   Culture     Final   Value: CULTURE IN PROGRESS FOR FOUR WEEKS     Performed at [facility]   Report Status PENDING  Incomplete  MRSA PCR SCREENING     Status: None   Collection Time    08/01/13 11:02 PM      Result Value Range Status   MRSA by PCR NEGATIVE  NEGATIVE Final   Comment:            The GeneXpert MRSA Assay (FDA     approved for NASAL specimens     only), is one component of a      comprehensive MRSA colonization     surveillance program. It is not     intended to diagnose MRSA     infection nor to guide or     monitor treatment for     MRSA infections.   Studies/Results: Ct Head Wo Contrast  08/01/2013   *RADIOLOGY REPORT*  Clinical Data:  Altered mental status  CT HEAD WITHOUT CONTRAST  Technique:  Contiguous axial images were obtained from the base of the skull through the vertex without contrast  Comparison:  None.  Findings:  The brain has a normal appearance without evidence for hemorrhage, acute infarction, hydrocephalus, or mass lesion.  There is no extra axial fluid collection.  The skull and paranasal sinuses are normal. Minor atherosclerotic changes of the intracranial vessels.  IMPRESSION:  No acute intracranial finding by noncontrast CT.   Original Report Authenticated By: Judie Petit. Miles Costain, M.D.   Medications:  Scheduled Meds: . antiseptic oral rinse  15 mL Mouth Rinse q12n4p  . benztropine mesylate  1 mg Intravenous BID  . chlorhexidine  15 mL Mouth Rinse BID  . heparin  5,000 Units Subcutaneous Q8H  . levothyroxine  40 mcg Intravenous Daily  . potassium chloride  10 mEq Intravenous Q1 Hr x 4  . sodium chloride  1,000 mL Intravenous Once  . sodium chloride  3 mL Intravenous Q12H   Continuous Infusions: . sodium chloride     PRN Meds:.sodium chloride, LORazepam, metoprolol, sodium chloride Assessment/Plan:  Altered Mental Status with muscle rigidity: Improving. Patient symptoms are most likely caused by neuroleptic malignant syndrome 2/2 to anticholinergic medication, benztropine. Patient reports that she had not been taking his benztropin for at least 3 days before admission.  She has diffused rigidity and decreased level of consciousness. CT head and neck to for acute abnormalities. Urology was consulted, Dr. 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 LP was performed, CSF showed RBC 36900 and WBC 4, which was  likely due to traumatic injury during the procedure, however the ratio of RBC/WBC is 9000 which is not completely consistent with traumatic injury. The potential differential diagnosis is herpetic encephalopathy, though less likely than NMS. Since patient seems responded to the resuming of benztropine with clinical improvement, will defer the further workup for herpetic encephalopathy now.   -Appreciate Neurology recommendations -Will continue with supportive therapy and close monitoring -Utilize cooling blankets if hyperthermia evident -Continue benztropine -Cont Ativan -Monitor in SDU -Likely consult psych -Pending CSF culture  Rhabdomyolysis: improving.  Ck was elevated at  15.6 K. It is most likely due to neuroleptic Malignant Syndrome. Patienthas been treated with aggressive IV fluid, normal saline 250 cc per hour. He responded to treatment. CK level trending down:  15.6 K-->8.6 K. Cre: 1.73-->1.49-->1.28. K=3.2 which was repleted. Phosphorus level was normal.  -Will trend CK qdaily -continue to monitor for electrolyte abnormalities -Change IV NS 250 to 150 cc/h  Acute Kidney Injury: improving.  Likely secondary to rhabdomyolysis with creatinine level 1.73 (baseline creatinine .9-1.1). Cre is trending down  1.73-->1.49-->1.28.  -continue IV NS 150cc/h  #: Hypertension: Blood pressure is elevated at 177/113 mmHg.    -will restart home medications, metoprolol 25 mg twice a day and amlodipine 10 mg daily. - IV hydralazine prn  # Elevated Transaminases Most likely 2/2 to chronic alcohol use as AST>ALT and patient heavy alcohol consumption daily, but likely also due to rhabdomyolysis as this can cause minor elevations of LFTs. - Trend LFTs  # Bipolar I - Hold Depakote 500 mg BID - Hold Seroquel 200 mg BID - Of note, patient has long acting Haldol injection.(not continued during admission)  # Alcohol Abuse - CIWA held as patient is outside of w/d window (jail since 10/1) - ativan  prn  #: DVT PPX; Sq heparin  Dispo: Disposition is deferred at this time, awaiting improvement of current medical problems. Anticipated discharge in approximately 2-3 day(s).  The patient does not have a current PCP (Pcp Not In System) and does need an [facility] hospital follow-up appointment after discharge.  The patient does not know have transportation limitations that hinder transportation to clinic appointments.    LOS: 1 day   Lorretta Harp 08/02/2013, 1:41 PM

## 2013-08-02 NOTE — Progress Notes (Signed)
NEURO HOSPITALIST PROGRESS NOTE   SUBJECTIVE:                                                                                                                        Awake but remains poorly responsive, can not really engage in conversation. CSF showed no evidence of infection.   OBJECTIVE:                                                                                                                           Vital signs in last 24 hours: Temp:  [97.6 F (36.4 C)-100.3 F (37.9 C)] 98.4 F (36.9 C) (10/09 0816) Pulse Rate:  [82-93] 90 (10/09 0816) Resp:  [19-29] 21 (10/09 0816) BP: (128-157)/(88-118) 149/98 mmHg (10/09 0816) SpO2:  [95 %-100 %] 100 % (10/09 0816) Weight:  [69.9 kg (154 lb 1.6 oz)] 69.9 kg (154 lb 1.6 oz) (10/09 0432)  Intake/Output from previous day: 10/08 0701 - 10/09 0700 In: -  Out: 950 [Urine:950] Intake/Output this shift: Total I/O In: -  Out: 300 [Urine:300] Nutritional status: NPO  Past Medical History  Diagnosis Date  . Bipolar 1 disorder   . Hypertension   . Thyroid disease   . GERD (gastroesophageal reflux disease)   . Obstructive sleep apnea   . COPD (chronic obstructive pulmonary disease)     Neurologic Exam:  Mental status: awake but poorly responsive, answers " yes" to questions  and able to follow simple commands inconsistently. No dysarthria or dysphasia. CN 2-12: pupils 4 mm bilaterally, reactive to light. No gaze preference. EOM full without nystagmus. Fce symmetric. Tongue midline. Motor: moves all extremities symmetrically. Tone: increased all over DTRs: brisk all over Plantars: downgoing Sensory: no tested. Coordination and gait: no tested. No meningeal irritation signs.   Lab Results: No results found for this basename: cbc, bmp, coags, chol, tri, ldl, hga1c   Lipid Panel No results found for this basename: CHOL, TRIG, HDL, CHOLHDL, VLDL, LDLCALC,  in the last 72  hours  Studies/Results: Ct Head Wo Contrast  08/01/2013   *RADIOLOGY REPORT*  Clinical Data:  Altered mental status  CT HEAD WITHOUT CONTRAST  Technique:  Contiguous axial images were obtained from the base of the skull through the vertex  without contrast  Comparison:  None.  Findings:  The brain has a normal appearance without evidence for hemorrhage, acute infarction, hydrocephalus, or mass lesion.  There is no extra axial fluid collection.  The skull and paranasal sinuses are normal. Minor atherosclerotic changes of the intracranial vessels.  IMPRESSION:  No acute intracranial finding by noncontrast CT.   Original Report Authenticated By: Judie Petit. Shick, M.D.    MEDICATIONS                                                                                                                       I have reviewed the patient's current medications.  ASSESSMENT/PLAN:                                                                                                            52 y/o with altered mental status, fever, diffusely increase body rigidity, and overactive DTRs. CSF and neuro-imaging unimpressive. I agree that his clinical picture is most consistent with neuroleptic malignant syndrome and thus will suggest treatment for NMS. Will continue to follow.   Wyatt Portela, MD [facility] [phone]  08/02/2013, 8:23 AM

## 2013-08-02 NOTE — Progress Notes (Signed)
Utilization Review Completed.  

## 2013-08-02 NOTE — Consult Note (Signed)
Reason for Consult: Neuroleptic milligrams syndrome history of antipsychotic use for schizophrenia and bipolar disorder Referring Physician: Dr. Harvest Dark [name] is an 52 y.o. male.  HPI: Patient is seen face-to-face for evaluation. Patient has been lying down on his back and somewhat sedated. Patient is able to tell me his name and endorses has been taking psychiatric medication for bipolar disorder and schizophrenia. Patient was unable to give me the date information about what happened before coming to the hospital.  MSE: Patient has decreased psychomotor activity weakness in his extremities but no distress. Patient was able to move his extremities slowly and limited. Patient was able to open and close his eyes and able to talk in few words mostly yes or no. Patient denies suicidal or homicidal ideations and current psychotic symptoms.  Past Medical History  Diagnosis Date  . Bipolar 1 disorder   . Hypertension   . Thyroid disease   . GERD (gastroesophageal reflux disease)   . Obstructive sleep apnea   . COPD (chronic obstructive pulmonary disease)     History reviewed. No pertinent past surgical history.  Family History  Problem Relation Age of Onset  . Cirrhosis Mother   . Hypertension Mother   . Diabetes Mellitus II Mother     Social History:  reports that he has been smoking Cigarettes.  He has been smoking about 0.50 packs per day. He does not have any smokeless tobacco history on file. He reports that he drinks alcohol. He reports that he does not use illicit drugs.  Allergies: No Known Allergies  Medications: I have reviewed the patient's current medications.  Results for orders placed during the hospital encounter of 08/01/13 (from the past 48 hour(s))  AMMONIA     Status: None   Collection Time    08/01/13  2:02 PM      Result Value Range   Ammonia 40  11 - 60 umol/L  CBC WITH DIFFERENTIAL     Status: Abnormal   Collection Time    08/01/13  2:02 PM   Result Value Range   WBC 11.4 (*) 4.0 - 10.5 K/uL   RBC 4.72  4.22 - 5.81 MIL/uL   Hemoglobin 14.3  13.0 - 17.0 g/dL   HCT 45.4  09.8 - 11.9 %   MCV 86.7  78.0 - 100.0 fL   MCH 30.3  26.0 - 34.0 pg   MCHC 35.0  30.0 - 36.0 g/dL   RDW 14.7  82.9 - 56.2 %   Platelets 205  150 - 400 K/uL   Neutrophils Relative % 72  43 - 77 %   Neutro Abs 8.3 (*) 1.7 - 7.7 K/uL   Lymphocytes Relative 12  12 - 46 %   Lymphs Abs 1.4  0.7 - 4.0 K/uL   Monocytes Relative 15 (*) 3 - 12 %   Monocytes Absolute 1.7 (*) 0.1 - 1.0 K/uL   Eosinophils Relative 0  0 - 5 %   Eosinophils Absolute 0.0  0.0 - 0.7 K/uL   Basophils Relative 0  0 - 1 %   Basophils Absolute 0.0  0.0 - 0.1 K/uL  COMPREHENSIVE METABOLIC PANEL     Status: Abnormal   Collection Time    08/01/13  2:02 PM      Result Value Range   Sodium 136  135 - 145 mEq/L   Potassium 3.4 (*) 3.5 - 5.1 mEq/L   Chloride 95 (*) 96 - 112 mEq/L   CO2 25  19 -  32 mEq/L   Glucose, Bld 113 (*) 70 - 99 mg/dL   BUN 62 (*) 6 - 23 mg/dL   Creatinine, Ser 1.61 (*) 0.50 - 1.35 mg/dL   Calcium 09.6  8.4 - 04.5 mg/dL   Total Protein 8.9 (*) 6.0 - 8.3 g/dL   Albumin 4.5  3.5 - 5.2 g/dL   AST 409 (*) 0 - 37 U/L   ALT 86 (*) 0 - 53 U/L   Alkaline Phosphatase 81  39 - 117 U/L   Total Bilirubin 0.2 (*) 0.3 - 1.2 mg/dL   GFR calc non Af Amer 44 (*) >90 mL/min   GFR calc Af Amer 51 (*) >90 mL/min   Comment: (NOTE)     The eGFR has been calculated using the CKD EPI equation.     This calculation has not been validated in all clinical situations.     eGFR's persistently <90 mL/min signify possible Chronic Kidney     Disease.  CK TOTAL AND CKMB     Status: Abnormal   Collection Time    08/01/13  2:02 PM      Result Value Range   Total CK 15692 (*) 7 - 232 U/L   CK, MB 35.5 (*) 0.3 - 4.0 ng/mL   Comment: CRITICAL RESULT CALLED TO, READ BACK BY AND VERIFIED WITH:     A LARSON,RN 1853 08/01/13 WBOND   Relative Index 0.2  0.0 - 2.5  LACTIC ACID, PLASMA     Status: None    Collection Time    08/01/13  2:03 PM      Result Value Range   Lactic Acid, Venous 1.6  0.5 - 2.2 mmol/L  URINE RAPID DRUG SCREEN (HOSP PERFORMED)     Status: Abnormal   Collection Time    08/01/13  2:58 PM      Result Value Range   Opiates NONE DETECTED  NONE DETECTED   Cocaine NONE DETECTED  NONE DETECTED   Benzodiazepines POSITIVE (*) NONE DETECTED   Amphetamines NONE DETECTED  NONE DETECTED   Tetrahydrocannabinol NONE DETECTED  NONE DETECTED   Barbiturates NONE DETECTED  NONE DETECTED   Comment:            DRUG SCREEN FOR MEDICAL PURPOSES     ONLY.  IF CONFIRMATION IS NEEDED     FOR ANY PURPOSE, NOTIFY LAB     WITHIN 5 DAYS.                LOWEST DETECTABLE LIMITS     FOR URINE DRUG SCREEN     Drug Class       Cutoff (ng/mL)     Amphetamine      1000     Barbiturate      200     Benzodiazepine   200     Tricyclics       300     Opiates          300     Cocaine          300     THC              50  URINALYSIS, ROUTINE W REFLEX MICROSCOPIC     Status: Abnormal   Collection Time    08/01/13  2:58 PM      Result Value Range   Color, Urine YELLOW  YELLOW   APPearance CLEAR  CLEAR   Specific Gravity, Urine 1.020  1.005 - 1.030  pH 5.0  5.0 - 8.0   Glucose, UA NEGATIVE  NEGATIVE mg/dL   Hgb urine dipstick MODERATE (*) NEGATIVE   Bilirubin Urine NEGATIVE  NEGATIVE   Ketones, ur NEGATIVE  NEGATIVE mg/dL   Protein, ur >161 (*) NEGATIVE mg/dL   Urobilinogen, UA 0.2  0.0 - 1.0 mg/dL   Nitrite NEGATIVE  NEGATIVE   Leukocytes, UA NEGATIVE  NEGATIVE  URINE MICROSCOPIC-ADD ON     Status: Abnormal   Collection Time    08/01/13  2:58 PM      Result Value Range   Squamous Epithelial / LPF RARE  RARE   WBC, UA 0-2  <3 WBC/hpf   RBC / HPF 0-2  <3 RBC/hpf   Bacteria, UA RARE  RARE   Casts HYALINE CASTS (*) NEGATIVE   Comment: GRANULAR CAST  RAPID HIV SCREEN ([facility])     Status: None   Collection Time    08/01/13  3:49 PM      Result Value Range   SUDS Rapid HIV Screen  NON REACTIVE  NON REACTIVE  TSH     Status: Abnormal   Collection Time    08/01/13  4:38 PM      Result Value Range   TSH 6.108 (*) 0.350 - 4.500 uIU/mL   Comment: Performed at [facility]  PROTEIN AND GLUCOSE, CSF     Status: Abnormal   Collection Time    08/01/13  5:17 PM      Result Value Range   Glucose, CSF 93 (*) 43 - 76 mg/dL   Total  Protein, CSF 64 (*) 15 - 45 mg/dL  CSF CELL COUNT WITH DIFFERENTIAL     Status: Abnormal   Collection Time    08/01/13  5:17 PM      Result Value Range   Tube # 1     Color, CSF RED (*) COLORLESS   Appearance, CSF CLOUDY (*) CLEAR   Supernatant XANTHOCHROMIC     RBC Count, CSF 36900 (*) 0 /cu mm   WBC, CSF 4  0 - 5 /cu mm   Segmented Neutrophils-CSF FEW  0 - 6 %   Lymphs, CSF RARE  40 - 80 %   Monocyte-Macrophage-Spinal Fluid RARE  15 - 45 %   Eosinophils, CSF NONE SEEN  0 - 1 %  GRAM STAIN     Status: None   Collection Time    08/01/13  5:17 PM      Result Value Range   Specimen Description CSF     Special Requests 0.6ML FLUID     Gram Stain       Value: CYTOSPIN PREP     WBC PRESENT,BOTH PMN AND MONONUCLEAR     NO ORGANISMS SEEN     Gram Stain Report Called to,Read Back By and Verified With: RN LARSON, A 1822 08/01/13 Riki Rusk.   Report Status 08/01/2013 FINAL    CSF CULTURE     Status: None   Collection Time    08/01/13  5:17 PM      Result Value Range   Specimen Description CSF     Special Requests 0.6ML FLUID     Gram Stain       Value: WBC PRESENT,BOTH PMN AND MONONUCLEAR     NO ORGANISMS SEEN     CYTOSPIN Performed at [facility]     Performed at [facility]   Culture       Value: NO GROWTH 1 DAY  Note: Gram Stain Report Called to,Read Back By and Verified With: RN Braulio Conte 08/01/13 Riki Rusk     Performed at [facility]   Report Status PENDING    FUNGUS CULTURE W SMEAR     Status: None   Collection Time    08/01/13  5:17 PM      Result Value Range   Specimen Description CSF      Special Requests 0.6ML FLUID     Fungal Smear       Value: NO YEAST OR FUNGAL ELEMENTS SEEN     Performed at [facility]   Culture       Value: CULTURE IN PROGRESS FOR FOUR WEEKS     Performed at [facility]   Report Status PENDING    CBC     Status: Abnormal   Collection Time    08/01/13  6:25 PM      Result Value Range   WBC 10.5  4.0 - 10.5 K/uL   RBC 4.25  4.22 - 5.81 MIL/uL   Hemoglobin 12.3 (*) 13.0 - 17.0 g/dL   HCT 47.8 (*) 29.5 - 62.1 %   MCV 87.5  78.0 - 100.0 fL   MCH 28.9  26.0 - 34.0 pg   MCHC 33.1  30.0 - 36.0 g/dL   RDW 30.8  65.7 - 84.6 %   Platelets 188  150 - 400 K/uL  CREATININE, SERUM     Status: Abnormal   Collection Time    08/01/13  6:25 PM      Result Value Range   Creatinine, Ser 1.49 (*) 0.50 - 1.35 mg/dL   GFR calc non Af Amer 52 (*) >90 mL/min   GFR calc Af Amer 61 (*) >90 mL/min   Comment: (NOTE)     The eGFR has been calculated using the CKD EPI equation.     This calculation has not been validated in all clinical situations.     eGFR's persistently <90 mL/min signify possible Chronic Kidney     Disease.  TROPONIN I     Status: None   Collection Time    08/01/13  7:15 PM      Result Value Range   Troponin I <0.30  <0.30 ng/mL   Comment:            Due to the release kinetics of cTnI,     a negative result within the first hours     of the onset of symptoms does not rule out     myocardial infarction with certainty.     If myocardial infarction is still suspected,     repeat the test at appropriate intervals.  PROTIME-INR     Status: Abnormal   Collection Time    08/01/13  7:30 PM      Result Value Range   Prothrombin Time 15.3 (*) 11.6 - 15.2 seconds   INR 1.24  0.00 - 1.49  PHOSPHORUS     Status: None   Collection Time    08/01/13  7:30 PM      Result Value Range   Phosphorus 3.2  2.3 - 4.6 mg/dL  FERRITIN     Status: None   Collection Time    08/01/13  7:30 PM      Result Value Range   Ferritin 294  22 -  322 ng/mL   Comment: Performed at [facility]  MRSA PCR SCREENING     Status: None  Collection Time    08/01/13 11:02 PM      Result Value Range   MRSA by PCR NEGATIVE  NEGATIVE   Comment:            The GeneXpert MRSA Assay (FDA     approved for NASAL specimens     only), is one component of a     comprehensive MRSA colonization     surveillance program. It is not     intended to diagnose MRSA     infection nor to guide or     monitor treatment for     MRSA infections.  COMPREHENSIVE METABOLIC PANEL     Status: Abnormal   Collection Time    08/02/13  3:45 AM      Result Value Range   Sodium 142  135 - 145 mEq/L   Potassium 3.2 (*) 3.5 - 5.1 mEq/L   Chloride 104  96 - 112 mEq/L   CO2 25  19 - 32 mEq/L   Glucose, Bld 104 (*) 70 - 99 mg/dL   BUN 48 (*) 6 - 23 mg/dL   Creatinine, Ser 8.29  0.50 - 1.35 mg/dL   Calcium 9.2  8.4 - 56.2 mg/dL   Total Protein 7.4  6.0 - 8.3 g/dL   Albumin 3.6  3.5 - 5.2 g/dL   AST 130 (*) 0 - 37 U/L   ALT 74 (*) 0 - 53 U/L   Alkaline Phosphatase 68  39 - 117 U/L   Total Bilirubin 0.3  0.3 - 1.2 mg/dL   GFR calc non Af Amer 63 (*) >90 mL/min   GFR calc Af Amer 73 (*) >90 mL/min   Comment: (NOTE)     The eGFR has been calculated using the CKD EPI equation.     This calculation has not been validated in all clinical situations.     eGFR's persistently <90 mL/min signify possible Chronic Kidney     Disease.  TROPONIN I     Status: None   Collection Time    08/02/13  3:45 AM      Result Value Range   Troponin I <0.30  <0.30 ng/mL   Comment:            Due to the release kinetics of cTnI,     a negative result within the first hours     of the onset of symptoms does not rule out     myocardial infarction with certainty.     If myocardial infarction is still suspected,     repeat the test at appropriate intervals.  CBC     Status: Abnormal   Collection Time    08/02/13  3:45 AM      Result Value Range   WBC 7.6  4.0 - 10.5 K/uL    RBC 4.12 (*) 4.22 - 5.81 MIL/uL   Hemoglobin 12.1 (*) 13.0 - 17.0 g/dL   HCT 86.5 (*) 78.4 - 69.6 %   MCV 87.4  78.0 - 100.0 fL   MCH 29.4  26.0 - 34.0 pg   MCHC 33.6  30.0 - 36.0 g/dL   RDW 29.5  28.4 - 13.2 %   Platelets 186  150 - 400 K/uL  GLUCOSE, CAPILLARY     Status: Abnormal   Collection Time    08/02/13  9:19 AM      Result Value Range   Glucose-Capillary 106 (*) 70 - 99 mg/dL  CK TOTAL AND CKMB  Status: Abnormal   Collection Time    08/02/13 10:49 AM      Result Value Range   Total CK 8655 (*) 7 - 232 U/L   CK, MB 10.7 (*) 0.3 - 4.0 ng/mL   Comment: CRITICAL VALUE NOTED.  VALUE IS CONSISTENT WITH PREVIOUSLY REPORTED AND CALLED VALUE.   Relative Index 0.1  0.0 - 2.5  TROPONIN I     Status: None   Collection Time    08/02/13 10:49 AM      Result Value Range   Troponin I <0.30  <0.30 ng/mL   Comment:            Due to the release kinetics of cTnI,     a negative result within the first hours     of the onset of symptoms does not rule out     myocardial infarction with certainty.     If myocardial infarction is still suspected,     repeat the test at appropriate intervals.    Ct Head Wo Contrast  08/01/2013   *RADIOLOGY REPORT*  Clinical Data:  Altered mental status  CT HEAD WITHOUT CONTRAST  Technique:  Contiguous axial images were obtained from the base of the skull through the vertex without contrast  Comparison:  None.  Findings:  The brain has a normal appearance without evidence for hemorrhage, acute infarction, hydrocephalus, or mass lesion.  There is no extra axial fluid collection.  The skull and paranasal sinuses are normal. Minor atherosclerotic changes of the intracranial vessels.  IMPRESSION:  No acute intracranial finding by noncontrast CT.   Original Report Authenticated By: Judie Petit. Miles Costain, M.D.    Positive for bipolar and Neuroleptic malignant syndrome Blood pressure 139/88, pulse 85, temperature 98 F (36.7 C), temperature source Oral, resp. rate 28,  height 5\' 6"  (1.676 m), weight 69.9 kg (154 lb 1.6 oz), SpO2 100.00%.   Assessment/Plan: Neuroleptic malignant syndrome Schizoaffective disorder Bipolar disorder Questionable noncompliant with medications  Recommendation: Recommended supportive therapy Continue benztropine 1 mg twice daily intravenously Continue Ativan 1 mg 3 times daily intravenously Hold antipsychotic medication Haldol and Seroquel at this time Appreciate psychiatric consultation Will followup as clinically needed   Nehemiah Settle., M.D. 08/02/2013, 5:40 PM

## 2013-08-02 NOTE — Progress Notes (Signed)
Subjective: Mr. Weimar is able to state that he feels better today but is overall still poorly responsive. He is able to state his name and that he is in the hospital but does not know what year it is. When asked if he remembers events from yesterday he says no. He does not know how long he has been in the hospital or when was the last time he saw an outpatient doctor. He says no when asked if he has a psychiatrist. He is unable to state where he lives or who he lives with. Denies headache, chest pain, or shortness of breath. He does say he has some neck pain. He asks several times during the evaluation for something to drink.   I spoke to [place] at [facility]. Mr. Ardolino lives here normally. He is independent with his daily activities but stays there because he is on disability secondary to his mental health. [place] larceny and then failure to appear in court. He goes to [facility] for his psychiatric care. He has been getting the haloperidol decanoate injection for years and his most recent injection was on Sept 24th.    Objective: Vital signs in last 24 hours: Filed Vitals:   08/01/13 2300 08/02/13 0056 08/02/13 0432 08/02/13 0816  BP: 137/88 138/90 141/92 149/98  Pulse: 82 90 93 90  Temp: 97.6 F (36.4 C) 97.8 F (36.6 C) 97.6 F (36.4 C) 98.4 F (36.9 C)  TempSrc: Oral Oral Oral Axillary  Resp: 23 19 21 21   Height: 5\' 6"  (1.676 m)     Weight: 69.9 kg (154 lb 1.6 oz)  69.9 kg (154 lb 1.6 oz)   SpO2: 100% 100% 100% 100%   Weight change:   Intake/Output Summary (Last 24 hours) at 08/02/13 1023 Last data filed at 08/02/13 0817  Gross per 24 hour  Intake      0 ml  Output   1250 ml  Net  -1250 ml   Physical Exam  Eyes: Pupils are equal, round, and reactive to light.  Cardiovascular: Normal rate, regular rhythm, S1 normal, S2 normal and normal heart sounds.   Pulmonary/Chest: Effort normal and breath sounds normal.    Abdominal: Soft. Bowel sounds are normal. There is no tenderness.  Musculoskeletal:  Increased tone and rigidity present in all 4 extremities  Neurological: He is alert.  Oriented x 2 (not to time) Pupils are 5 mm bilaterally and reactive to light. Cranial nerves are grossly intact. Motor: 4-/5 BUE, 2/5 BLE Sensory: 5/5 throughout DTRs: 3+ throughout Coordination and gait: unable to test  Skin: Skin is warm and dry.    Lab Results: Basic Metabolic Panel:  Recent Labs Lab 08/01/13 1402 08/01/13 1825 08/01/13 1930 08/02/13 0345  NA 136  --   --  142  K 3.4*  --   --  3.2*  CL 95*  --   --  104  CO2 25  --   --  25  GLUCOSE 113*  --   --  104*  BUN 62*  --   --  48*  CREATININE 1.73* 1.49*  --  1.28  CALCIUM 10.2  --   --  9.2  PHOS  --   --  3.2  --    Liver Function Tests:  Recent Labs Lab 08/01/13 1402 08/02/13 0345  AST 283* 227*  ALT 86* 74*  ALKPHOS 81 68  BILITOT 0.2* 0.3  PROT 8.9* 7.4  ALBUMIN 4.5 3.6   CBC:  Recent Labs Lab 08/01/13 1402 08/01/13 1825 08/02/13 0345  WBC 11.4* 10.5 7.6  NEUTROABS 8.3*  --   --   HGB 14.3 12.3* 12.1*  HCT 40.9 37.2* 36.0*  MCV 86.7 87.5 87.4  PLT 205 188 186   Cardiac Enzymes:  Recent Labs Lab 08/01/13 1402 08/01/13 1915 08/02/13 0345  CKTOTAL 16109*  --   --   CKMB 35.5*  --   --   TROPONINI  --  <0.30 <0.30   Thyroid Function Tests:  Recent Labs Lab 08/01/13 1638  TSH 6.108*   Anemia Panel:  Recent Labs Lab 08/01/13 1930  FERRITIN 294   Micro Results: Recent Results (from the past 240 hour(s))  GRAM STAIN     Status: None   Collection Time    08/01/13  5:17 PM      Result Value Range Status   Specimen Description CSF   Final   Special Requests 0.6ML FLUID   Final   Gram Stain     Final   Value: CYTOSPIN PREP     WBC PRESENT,BOTH PMN AND MONONUCLEAR     NO ORGANISMS SEEN     Gram Stain Report Called to,Read Back By and Verified With: RN LARSON, A 1822 08/01/13 Riki Rusk.   Report  Status 08/01/2013 FINAL   Final  CSF CULTURE     Status: None   Collection Time    08/01/13  5:17 PM      Result Value Range Status   Specimen Description CSF   Final   Special Requests 0.6ML FLUID   Final   Gram Stain     Final   Value: WBC PRESENT,BOTH PMN AND MONONUCLEAR     NO ORGANISMS SEEN     CYTOSPIN Performed at [facility]     Performed at [facility]   Culture     Final   Value: NO GROWTH 1 DAY     Note: Gram Stain Report Called to,Read Back By and Verified With: RN Braulio Conte 08/01/13 Riki Rusk     Performed at [facility]   Report Status PENDING   Incomplete  MRSA PCR SCREENING     Status: None   Collection Time    08/01/13 11:02 PM      Result Value Range Status   MRSA by PCR NEGATIVE  NEGATIVE Final   Comment:            The GeneXpert MRSA Assay (FDA     approved for NASAL specimens     only), is one component of a     comprehensive MRSA colonization     surveillance program. It is not     intended to diagnose MRSA     infection nor to guide or     monitor treatment for     MRSA infections.   Medications: I have reviewed the patient's current medications. Scheduled Meds: . antiseptic oral rinse  15 mL Mouth Rinse q12n4p  . benztropine mesylate  1 mg Intravenous BID  . chlorhexidine  15 mL Mouth Rinse BID  . heparin  5,000 Units Subcutaneous Q8H  . levothyroxine  40 mcg Intravenous Daily  . potassium chloride  10 mEq Intravenous Q1 Hr x 4  . sodium chloride  1,000 mL Intravenous Once  . sodium chloride  3 mL Intravenous Q12H   Continuous Infusions: . sodium chloride     PRN Meds:.sodium chloride, LORazepam, metoprolol,  sodium chloride  Assessment/Plan: Mr. 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 Benztropine is an anticholinergic medication that also acts to increase the availability of dopamine, and thus acts to decrease some of the side effects of antidopaminergic medications. Although Mr. Gamblin has been on the haldol decanoate injection for years, NMS can occur at any time and the abrupt cessation of benztropine likely precipitated this event. He received his most recent haldol decanoate injection on Oct 24th. Some reports describe that the sequence of events in NMS may occur in the following order: mental status changes, rigidity, hyperthermia, and autonomic dysfunction. Other reports describe that there is no typical course. Currently this patient has mental status changes and rigidity, though it appears that the low grade fever that he presented with has resolved and his vital signs have also remained stable. It is recommended that supportive therapy is tried as first line treatment. If there is no response to supportive therapy in 1-2 days then medications may be tried. Dantrolene is a possibility but is hepatotoxic and is not recommended for patients who already have very abnormal LFTs. Another suggestion is bromocriptine; this would be an appropriate choice for this patient since it is tolerated well in patients such as Mr. Dikes who have a history of psychosis. It should be continued for 10 days after NMS is controlled and then tapered. A second line treatment to bromocriptine would be amantadine. If no response is seen to medications after 1 week, then ECT can be tried. The mean recovery time for NMS is 7-11 days. However, we should keep in mind that this patient has risk factors that predict a more prolonged course, including depot antipsychotic use, myoglobinuria, renal failure, and alcohol addiction.    - Continue supportive therapy with IVF and close monitoring  - Continue q2 hr vitals  - Ativan 1mg  IV q6hr PRN. We will give him one scheduled dose of  Ativan 0.5mg  IV tonight. We will give this to him at 10 pm to avoid the possibility of altering his mental status before his evaluation by psychiatry. (Ativan has been shown to help with muscle rigidity.)  - Continue to monitor for electrolyte abnormalities including hypocalcemia, hypomagnesemia, hypo/hypernatremia, hyperkalemia, and metabolic acidosis  - Leukocytosis improving. Continue daily CBC.  - Cooling blankets if hyperthermia is evident  - Neurology is following, appreciate recs. Psychiatry has been consulted, appreciate recs.  - CSF culture and fungal cultures are NGTD and support our theory that this is a noninfectious process.   # Rhabdomyolysis - The likely etiology of his rhabdomyolysis is a combination of his NMS and prolonged immobilization. His electrolytes are improving and his potassium has been repleted. CK-MB has improved from 35.5 to 10.7. CK Total has improved from 15.7k to 8.7k. The degree of elevation of CK has been shown to correlate with disease severity and prognosis. CK levels also may never normalize after an episode of NMS. Patients prone to NMS are also more likely to have elevated baseline CK. This patient had a CK Total of 340 in 04/2007 which is consistent with this fact and adds further support to our diagnosis. Rhabdo may cause hypophosphatemia secondary to shortage of phosphate for ATP, however Mr. Kreiser has  a normal phosphorus level. His TSH was elevated at 6.108, consistent with his hypothyroidism and rules out thyroid hormone excess as a cause of his rhabdo.       - Continue to trend CK Total daily  - Continue IVF at 150 ml/hr (no cardiac hx on review of records)  # Acute Kidney Injury - This is likely secondary to rhabdomyolysis. His kidney function has been improving. BUN and creatinine have improved from 62 and 1.73 to 48 and 1.28 respectively.  - Continue to monitor kidney function.  - Continue IVF as above.  - Strict I/Os  # Elevated Transaminases -  Improving. See previous note.  # HTN - Mr. Shenker was hypertensive today to 170s systolic. We have restarted his home meds, metoprolol tartrate and amlodipine. Holding lisinopril pending further resolution of kidney function.  # Bipolar I - Unchanged. See previous note.  # Alcohol Abuse - Unchanged. See previous note.  Dispo: Disposition is deferred at this time, awaiting improvement of current medical problems.  The patient does not have a current PCP (Pcp Not In System) and does need an [facility] hospital follow-up appointment after discharge.  The patient does not know have transportation limitations that hinder transportation to clinic appointments.  This is a Psychologist, occupational Note.  The care of the patient was discussed with Dr. Orvan Falconer and the assessment and plan formulated with their assistance.  Please see their attached note for official documentation of the daily encounter.   LOS: 1 day   Kerrie Pleasure, Med Student 08/02/2013, 10:23 AM  Cliffton Asters, MD [facility] for Infectious Disease [facility] [phone] pager   [phone] cell 08/03/2013, 2:11 PM

## 2013-08-02 NOTE — Progress Notes (Signed)
Pt transferred to 2C06, report given to [facility]. CCMD notified of patient transfer, placed on portable telemetry box, 2 L [place]. Pt lethargic, arousable. Responds to voices. Remains confused.  Last set of vitals:  Filed Vitals:   08/01/13 2300  BP: 137/88  Pulse: 82  Temp: 97.6 F (36.4 C)  Resp: 23   Gilman Schmidt

## 2013-08-02 NOTE — Progress Notes (Signed)
Rn administered Stroke swallow screen; pt able to drink water, eat applesauce and crackers without incident; PA Rinehuls paged asking for a diet order; PA Rinehuls ordered a cardiac diet; will continue to monitor patient.

## 2013-08-03 DIAGNOSIS — G210 Malignant neuroleptic syndrome: Principal | ICD-10-CM

## 2013-08-03 LAB — COMPREHENSIVE METABOLIC PANEL
ALT: 68 U/L — ABNORMAL HIGH (ref 0–53)
AST: 150 U/L — ABNORMAL HIGH (ref 0–37)
Alkaline Phosphatase: 60 U/L (ref 39–117)
BUN: 30 mg/dL — ABNORMAL HIGH (ref 6–23)
CO2: 26 mEq/L (ref 19–32)
Calcium: 8.7 mg/dL (ref 8.4–10.5)
Chloride: 101 mEq/L (ref 96–112)
GFR calc Af Amer: 87 mL/min — ABNORMAL LOW (ref >90)
GFR calc non Af Amer: 75 mL/min — ABNORMAL LOW (ref >90)
Glucose, Bld: 87 mg/dL (ref 70–99)
Potassium: 3.1 mEq/L — ABNORMAL LOW (ref 3.5–5.1)
Sodium: 137 mEq/L (ref 135–145)
Total Bilirubin: 0.2 mg/dL — ABNORMAL LOW (ref 0.3–1.2)
Total Protein: 6.6 g/dL (ref 6.0–8.3)

## 2013-08-03 LAB — CBC
HCT: 29.8 % — ABNORMAL LOW (ref 39.0–52.0)
Hemoglobin: 9.8 g/dL — ABNORMAL LOW (ref 13.0–17.0)
MCH: 29.3 pg (ref 26.0–34.0)
MCHC: 32.9 g/dL (ref 30.0–36.0)
RDW: 14.6 % (ref 11.5–15.5)

## 2013-08-03 LAB — CK: Total CK: 5755 U/L — ABNORMAL HIGH (ref 7–232)

## 2013-08-03 MED ORDER — LEVOTHYROXINE SODIUM 75 MCG PO TABS
75.0000 ug | ORAL_TABLET | Freq: Every day | ORAL | Status: DC
  Administered 2013-08-04 – 2013-08-06 (×3): 75 ug via ORAL
  Filled 2013-08-03 (×4): qty 1

## 2013-08-03 NOTE — Progress Notes (Signed)
NEURO HOSPITALIST PROGRESS NOTE   SUBJECTIVE:                                                                                                                        Feeling better today. Eating breakfast. Offers no new neurological complains.  OBJECTIVE:                                                                                                                           Vital signs in last 24 hours: Temp:  [98 F (36.7 C)-98.6 F (37 C)] 98.4 F (36.9 C) (10/10 0836) Pulse Rate:  [68-116] 71 (10/10 0938) Resp:  [20-36] 20 (10/10 0836) BP: (133-177)/(82-124) 148/85 mmHg (10/10 0938) SpO2:  [99 %-100 %] 100 % (10/10 0836) Weight:  [74.3 kg (163 lb 12.8 oz)] 74.3 kg (163 lb 12.8 oz) (10/10 0500)  Intake/Output from previous day: 10/09 0701 - 10/10 0700 In: 2567.5 [I.V.:2167.5; IV Piggyback:400] Out: 1600 [Urine:1600] Intake/Output this shift:   Nutritional status: Cardiac  Past Medical History  Diagnosis Date  . Bipolar 1 disorder   . Hypertension   . Thyroid disease   . GERD (gastroesophageal reflux disease)   . Obstructive sleep apnea   . COPD (chronic obstructive pulmonary disease)       Neurologic Exam:  Mental status: alert and awake, oriented to place but no to year-month-day.  No dysarthria or dysphasia.  CN 2-12: pupils 4 mm bilaterally, reactive to light. No gaze preference. EOM full without nystagmus. Fce symmetric. Tongue midline.  Motor: moves all extremities symmetrically.  Tone: increased all over  DTRs: brisk all over  Plantars: downgoing  Sensory: no tested.  Coordination and gait: no tested.  No meningeal irritation signs   Lab Results: No results found for this basename: cbc, bmp, coags, chol, tri, ldl, hga1c   Lipid Panel No results found for this basename: CHOL, TRIG, HDL, CHOLHDL, VLDL, LDLCALC,  in the last 72 hours  Studies/Results: Ct Head Wo Contrast  08/01/2013   *RADIOLOGY REPORT*  Clinical  Data:  Altered mental status  CT HEAD WITHOUT CONTRAST  Technique:  Contiguous axial images were obtained from the base of the skull through the vertex without contrast  Comparison:  None.  Findings:  The brain has a normal appearance without evidence for hemorrhage, acute infarction, hydrocephalus, or mass lesion.  There is no extra axial fluid collection.  The skull and paranasal sinuses are normal. Minor atherosclerotic changes of the intracranial vessels.  IMPRESSION:  No acute intracranial finding by noncontrast CT.   Original Report Authenticated By: Judie Petit. Shick, M.D.    MEDICATIONS                                                                                                                       I have reviewed the patient's current medications.  ASSESSMENT/PLAN:                                                                                                           Probable NMS. Continue current treatment. Will sign off. Please, call neurology with any questions or concerns.  Wyatt Portela, MD [facility] [phone]  08/03/2013, 10:19 AM

## 2013-08-03 NOTE — Progress Notes (Signed)
Patient complaining that "he had to pee and couldn't".  Bladder scan revealed >999 in bladder, lower abd firm, bladder displaced.  Foley cath placed without difficulty.  Clean yellow urine immediatly returned.  Patient voiced immediate relief.. Will continue to monitor closly

## 2013-08-03 NOTE — Progress Notes (Signed)
Noted 999 ml urine per bladder scan , pt awake trying to urinate,no urine noted at this time.

## 2013-08-03 NOTE — Progress Notes (Signed)
Subjective: [name] is drowsy this morning. He got a total of 1.5mg  Ativan yesterday evening. This morning he denies pain. He continues to move his upper extremities but not his lower extremities. He answers questions with yes/no. He states that he does not remember his name or the year. He states he knows where he is but is not able to tell me where.  On re-evaluation during morning rounds, [name] is much more alert. He is oriented x 3 and actively moving all extremities. Per his RN, he has tried to get out of bed.  Objective: Vital signs in last 24 hours: Filed Vitals:   08/03/13 0938 08/03/13 1000 08/03/13 1146 08/03/13 1304  BP: 148/85 158/107 143/88 140/77  Pulse: 71 85 86 87  Temp:    98.2 F (36.8 C)  TempSrc:    Oral  Resp:  16 23 15   Height:      Weight:      SpO2:  100% 99% 100%   Weight change: 4.4 kg (9 lb 11.2 oz)  Intake/Output Summary (Last 24 hours) at 08/03/13 1353 Last data filed at 08/03/13 1300  Gross per 24 hour  Intake 3292.5 ml  Output   1800 ml  Net 1492.5 ml   Physical Exam  Eyes: Pupils are equal, round, and reactive to light.  Cardiovascular: Normal rate, regular rhythm, S1 normal, S2 normal and normal heart sounds.  Pulmonary/Chest: Effort normal and breath sounds normal.  Abdominal: Soft. Bowel sounds are normal. There is no tenderness.  Musculoskeletal:  Improved rigidity in all 4 extremities Neurological: He is alert.  Pupils are 5 mm bilaterally and reactive to light. Cranial nerves are grossly intact. Motor: 4-/5 BUE, 3/5 BLE Sensory: 5/5 throughout DTRs: 2+ throughout Coordination and gait: unable to test  Skin: Skin is warm and dry.   Lab Results: Basic Metabolic Panel:  Recent Labs Lab 08/01/13 1930 08/02/13 0345 08/03/13 0435  NA  --  142 137  K  --  3.2* 3.1*  CL  --  104 101  CO2  --  25 26  GLUCOSE  --  104* 87  BUN  --  48* 30*  CREATININE  --  1.28 1.11  CALCIUM  --  9.2 8.7  PHOS 3.2  --   --    Liver  Function Tests:  Recent Labs Lab 08/02/13 0345 08/03/13 0435  AST 227* 150*  ALT 74* 68*  ALKPHOS 68 60  BILITOT 0.3 0.2*  PROT 7.4 6.6  ALBUMIN 3.6 3.0*   CBC:  Recent Labs Lab 08/01/13 1402  08/02/13 0345 08/03/13 0435  WBC 11.4*  < > 7.6 4.8  NEUTROABS 8.3*  --   --   --   HGB 14.3  < > 12.1* 9.8*  HCT 40.9  < > 36.0* 29.8*  MCV 86.7  < > 87.4 89.0  PLT 205  < > 186 187  < > = values in this interval not displayed. Cardiac Enzymes:  Recent Labs Lab 08/01/13 1402 08/01/13 1915 08/02/13 0345 08/02/13 1049 08/03/13 0435  CKTOTAL 40347*  --   --  8655* 5755*  CKMB 35.5*  --   --  10.7*  --   TROPONINI  --  <0.30 <0.30 <0.30  --    Micro Results: Recent Results (from the past 240 hour(s))  GRAM STAIN     Status: None   Collection Time    08/01/13  5:17 PM      Result Value Range Status  Specimen Description CSF   Final   Special Requests 0.6ML FLUID   Final   Gram Stain     Final   Value: CYTOSPIN PREP     WBC PRESENT,BOTH PMN AND MONONUCLEAR     NO ORGANISMS SEEN     Gram Stain Report Called to,Read Back By and Verified With: RN Cyril Mourning, A 1822 08/01/13 Riki Rusk.   Report Status 08/01/2013 FINAL   Final  CSF CULTURE     Status: None   Collection Time    08/01/13  5:17 PM      Result Value Range Status   Specimen Description CSF   Final   Special Requests 0.6ML FLUID   Final   Gram Stain     Final   Value: WBC PRESENT,BOTH PMN AND MONONUCLEAR     NO ORGANISMS SEEN     CYTOSPIN Performed at [facility]     Performed at [facility]   Culture     Final   Value: NO GROWTH 2 DAYS     Note: Gram Stain Report Called to,Read Back By and Verified With: RN Braulio Conte 08/01/13 Riki Rusk     Performed at [facility]   Report Status PENDING   Incomplete  FUNGUS CULTURE W SMEAR     Status: None   Collection Time    08/01/13  5:17 PM      Result Value Range Status   Specimen Description CSF   Final   Special Requests 0.6ML FLUID    Final   Fungal Smear     Final   Value: NO YEAST OR FUNGAL ELEMENTS SEEN     Performed at [facility]   Culture     Final   Value: CULTURE IN PROGRESS FOR FOUR WEEKS     Performed at [facility]   Report Status PENDING   Incomplete  MRSA PCR SCREENING     Status: None   Collection Time    08/01/13 11:02 PM      Result Value Range Status   MRSA by PCR NEGATIVE  NEGATIVE Final   Comment:            The GeneXpert MRSA Assay (FDA     approved for NASAL specimens     only), is one component of a     comprehensive MRSA colonization     surveillance program. It is not     intended to diagnose MRSA     infection nor to guide or     monitor treatment for     MRSA infections.   Medications: I have reviewed the patient's current medications. Scheduled Meds: . amLODipine  10 mg Oral Daily  . antiseptic oral rinse  15 mL Mouth Rinse q12n4p  . benztropine mesylate  1 mg Intravenous BID  . chlorhexidine  15 mL Mouth Rinse BID  . heparin  5,000 Units Subcutaneous Q8H  . [START ON 08/04/2013] levothyroxine  75 mcg Oral QAC breakfast  . LORazepam  1 mg Intravenous TID  . metoprolol tartrate  25 mg Oral BID  . sodium chloride  1,000 mL Intravenous Once  . sodium chloride  3 mL Intravenous Q12H   Continuous Infusions: . 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   # Neuroleptic Malignant Syndrome - [name]' mental status and muscle rigidity are improving and he is less hyperreflexic. His vital signs continue to remain stable with no signs of hyperthermia or autonomic dysfunction. His blood work continues to improve and cultures remain negative. Per RN, he has not been voiding well and required in and out cath this morning; while it  is possible that benztropine may contribute to urinary retention this is less likely as [name] was on benztropine as a home medication. As he becomes more alert and starts ambulating we expect that he will be able to void on his own.  - Continue supportive therapy with IVF and close monitoring. Encourage ambulation.  - Continue q2 hr vitals  - Ativan 1mg  IV q6hr PRN to avoid oversedation  - Consider bromocriptine if no improvement  - Continue to monitor for electrolyte abnormalities including hypocalcemia, hypomagnesemia, hypo/hypernatremia, hyperkalemia, and metabolic acidosis   - Leukocytosis improving. Continue daily CBC.   - Cooling blankets if hyperthermia is evident   - Neurology and psych following, appreciate recs.  # Rhabdomyolysis - [name]' rhabdo continues to improve. His electrolytes are improving and his potassium has been repleted. CK Total has improved from 15.7k > 8.7k > 5.8k. CK levels may never normalize after an episode of NMS.   - Continue to trend CK Total daily   - Continue IVF at 150 ml/hr (no cardiac hx on review of records)   # Acute Kidney Injury - His kidney function continues to improve. BUN and creatinine have improved from 62 > 48 > 30 and 1.73 > 1.28 > 1.11 respectively.   - Continue to monitor kidney function.   - Continue IVF as above.   - Strict I/Os   # Elevated Transaminases - Improving. See previous note.   # HTN - His blood pressures have improved after restarting his home metoprolol and amlodipine. Since his kidney function has returned to baseline, we will consider restarting his home benazepril if he needs further control.    # Bipolar I - Unchanged. See previous note.   # Alcohol Abuse - Unchanged. See previous note.   Dispo: Disposition is deferred at this time, awaiting improvement of current medical problems.   The patient does not have a current PCP (Pcp Not In System) and does need an [facility] hospital follow-up appointment after discharge.   The patient does not know have transportation limitations that hinder transportation to clinic appointments.  This is a Psychologist, occupational Note.  The care of the patient was discussed with Dr. Orvan Falconer and Dr. Bosie Clos and the assessment and plan formulated with their assistance.  Please see their attached note for official documentation of the daily encounter.   LOS: 2 days   Kerrie Pleasure, Med Student 08/03/2013, 1:53 PM  Attending addendum: I seen and examined [name] with our medical team. He is lethargic from lorazepam but he is feeling much better. He is fully oriented and his rigidity is resolving. His rhabdomyolysis and acute renal insufficiency is also resolving. We will lighten lorazepam so that is not oversedated and continue IV fluids. We'll try to get him up and advance his diet over the next few days.  Cliffton Asters, MD [facility] for Infectious Disease [facility] [phone] pager   [phone] cell 08/03/2013, 2:12 PM

## 2013-08-04 LAB — COMPREHENSIVE METABOLIC PANEL
AST: 132 U/L — ABNORMAL HIGH (ref 0–37)
Albumin: 2.8 g/dL — ABNORMAL LOW (ref 3.5–5.2)
Alkaline Phosphatase: 59 U/L (ref 39–117)
BUN: 16 mg/dL (ref 6–23)
Chloride: 105 mEq/L (ref 96–112)
Creatinine, Ser: 0.9 mg/dL (ref 0.50–1.35)
GFR calc Af Amer: >90 mL/min (ref >90)
Glucose, Bld: 87 mg/dL (ref 70–99)
Potassium: 3.3 mEq/L — ABNORMAL LOW (ref 3.5–5.1)
Total Bilirubin: 0.2 mg/dL — ABNORMAL LOW (ref 0.3–1.2)
Total Protein: 6.1 g/dL (ref 6.0–8.3)

## 2013-08-04 LAB — CBC
HCT: 28.8 % — ABNORMAL LOW (ref 39.0–52.0)
Platelets: 179 10*3/uL (ref 150–400)
RBC: 3.29 MIL/uL — ABNORMAL LOW (ref 4.22–5.81)
RDW: 14.2 % (ref 11.5–15.5)
WBC: 4.7 10*3/uL (ref 4.0–10.5)

## 2013-08-04 LAB — GLUCOSE, CAPILLARY: Glucose-Capillary: 88 mg/dL (ref 70–99)

## 2013-08-04 LAB — CSF CULTURE W GRAM STAIN

## 2013-08-04 MED ORDER — ONDANSETRON HCL 4 MG/2ML IJ SOLN
4.0000 mg | Freq: Three times a day (TID) | INTRAMUSCULAR | Status: DC | PRN
  Administered 2013-08-04: 4 mg via INTRAVENOUS
  Filled 2013-08-04: qty 2

## 2013-08-04 MED ORDER — ONDANSETRON HCL 4 MG PO TABS: 4.0000 mg | ORAL_TABLET | Freq: Three times a day (TID) | ORAL | Status: DC | PRN

## 2013-08-04 NOTE — Progress Notes (Signed)
Per MD note- anticipate d/c in 2-3 days. Current plan is for d/c back to [facility] when medically stable. This is patient's wish for return.  Please consider ordering PT to insure that patient will be safe to return there.  RNCM can arrange Physical Therapy via home health is ok to return to ALF and needing PT. Many thanks!  Lorri Frederick. [place]ugh  [phone]

## 2013-08-04 NOTE — Evaluation (Signed)
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  Clinical Impression  Pt currently requiring assistance with all mobility.  If [facility] can provide assistance with mobility initially then pt could return there with HHPT.  If they can't pt may need ST-SNF.  Hopefully pt will progress rapidly and be able to return to [facility].    PT Assessment  Patient needs continued PT services    Follow Up Recommendations  Home health PT (at ALF if they can provide pt assistance with mobility initially)    Does the patient have the potential to tolerate intense rehabilitation      Barriers to Discharge        Equipment Recommendations  Rolling walker with 5" wheels    Recommendations for Other Services     Frequency Min 3X/week    Precautions / Restrictions Precautions Precautions: Fall   Pertinent Vitals/Pain VSS      Mobility  Bed Mobility Bed Mobility: Supine to Sit;Sitting - Scoot to Edge of Bed Supine to Sit: 4: Min assist;HOB elevated Sitting - Scoot to [place] of Bed: 4: Min assist Details for Bed Mobility Assistance: Assist to initiate movement. Transfers Transfers: Sit to Stand;Stand to Sit Sit to Stand: 4: Min assist;With upper extremity assist;With armrests;From bed;From chair/3-in-1 Stand to Sit: 4: Min assist;With upper extremity assist;With armrests;To chair/3-in-1 Details for Transfer Assistance: Assist to bring hips up due to posterior lean. Ambulation/Gait Ambulation/Gait Assistance: 4: Min assist Ambulation Distance (Feet): 120 Feet Assistive device: Rolling walker Ambulation/Gait  Assistance Details: Pt with initial posterior lean on standing with toes off of ground.  When began amb this improved.  Difficulty steering walker. Gait Pattern: Step-through pattern;Decreased stride length;Narrow base of support Gait velocity: decr    Exercises     PT Diagnosis: Difficulty walking;Abnormality of gait;Generalized weakness  PT Problem List: Decreased strength;Decreased activity tolerance;Decreased balance;Decreased mobility;Decreased knowledge of use of DME;Decreased cognition;Decreased knowledge of precautions PT Treatment Interventions: DME instruction;Gait training;Functional mobility training;Therapeutic activities;Therapeutic exercise;Balance training;Patient/family education     PT Goals(Current goals can be found in the care plan section) Acute Rehab PT Goals Patient Stated Goal: return to [facility] PT Goal Formulation: With patient Time For Goal Achievement: 08/11/13 Potential to Achieve Goals: Good  Visit Information  History of Present Illness: [name] is a 52 yo man with a PMH of bipolar disorder who is admitted with 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       Prior Functioning  Home Living Family/patient expects to be discharged to:: Assisted living Prior Function Level of Independence: Independent Comments: Amb independently at ALF. Communication Communication: No difficulties    Cognition  Cognition Arousal/Alertness: Awake/alert Behavior During Therapy: WFL for tasks assessed/performed Overall Cognitive Status: No family/caregiver present to determine baseline cognitive functioning (pt slow to respond at times. Unsure if baseline.)    Extremity/Trunk Assessment Lower Extremity Assessment Lower Extremity Assessment: Generalized weakness   Balance Balance Balance Assessed: Yes Static Standing Balance Static Standing - Balance Support: Bilateral upper extremity  supported;No upper extremity supported Static Standing - Level of Assistance: 4:  Min assist;3: Mod assist (mod A without walker and min A with walker.)  End of Session PT - End of Session Equipment Utilized During Treatment: Gait belt Activity Tolerance: Patient tolerated treatment well Patient left: in chair;with call bell/phone within reach Nurse Communication: Mobility status  GP     Karissa Meenan 08/04/2013, 4:31 PM  [employer] PT [phone]

## 2013-08-04 NOTE — Progress Notes (Signed)
I have seen the patient and reviewed the daily progress note by Kerrie Pleasure MS 4 and discussed the care of the patient with them.  See below for documentation of my findings, assessment, and plans.  Subjective: No acute events overnight, pt stating that he feels much better and agrees that he is improving  Objective: Vital signs in last 24 hours: Filed Vitals:   08/04/13 0427 08/04/13 0808 08/04/13 0943 08/04/13 0944  BP: 146/75 145/81 145/81   Pulse: 82 87  109  Temp: 98.7 F (37.1 C) 98.5 F (36.9 C)    TempSrc: Axillary Oral    Resp: 25 26    Height:      Weight:      SpO2: 100% 100%     Weight change:   Intake/Output Summary (Last 24 hours) at 08/04/13 1056 Last data filed at 08/04/13 0600  Gross per 24 hour  Intake   3555 ml  Output   3825 ml  Net   -270 ml   Physical Exam: General: Well-developed, well-nourished, AA, in no acute distress; pleasant and smiling today Head: Normocephalic, atraumatic. Eyes: PERRLA, EOMI Nose: Moist mucous membranes Throat: Oropharynx nonerythematous Neck: supple Lungs: Normal respiratory effort. Clear to auscultation bilaterally from apices to bases without crackles or wheezes appreciated. Heart: normal rate, regular rhythm, normal S1 and S2, no gallop, murmur, or rubs appreciated. Abdomen: BS normoactive. Soft, Nondistended, non-tender.  Extremities: No pretibial edema, distal pulses intact, mild cog wheeling in upper extremity, mild muscle rigidity UE and LE extremities Neurologic: slow movements, decreased strength throughout with 4/5 bilateral UE and 3/5 LE., alert and oriented to self Psych: no manic or depressive state today, appropriate and cooperative    Lab Results: Reviewed and documented in Electronic Record Micro Results: Reviewed and documented in Electronic Record Studies/Results: Reviewed and documented in Electronic Record Medications: I have reviewed the patient's current medications. Scheduled Meds: .  amLODipine  10 mg Oral Daily  . antiseptic oral rinse  15 mL Mouth Rinse q12n4p  . benztropine mesylate  1 mg Intravenous BID  . chlorhexidine  15 mL Mouth Rinse BID  . heparin  5,000 Units Subcutaneous Q8H  . levothyroxine  75 mcg Oral QAC breakfast  . LORazepam  1 mg Intravenous TID  . metoprolol tartrate  25 mg Oral BID  . sodium chloride  1,000 mL Intravenous Once  . sodium chloride  3 mL Intravenous Q12H   Continuous Infusions: . sodium chloride 150 mL/hr at 08/04/13 0503   PRN Meds:.sodium chloride, hydrALAZINE, sodium chloride  Assessment/Plan: #HD 3 for Mr. Visconti admitted with early neuroleptic malignant syndrome secondary to abrupt cessation of anticholinergic agents in setting of long-term dopaminergic medications including Haldol.  # 1. Neuroleptic Malignant Syndrome: mild, improving, no hyperthermia -cont Congentin -encourage ambulation -cont IVF therapy -cont close vital monitoring with consideration to transfer to med-surg tomorrow   #2 Rhabdomyolysis: Ck trending downward to 4K today, creatinine back to baseline -cont IVF therapy until CK below 1K  Cardiac Panel (last 3 results)  Recent Labs  08/01/13 1402 08/01/13 1915 08/02/13 0345 08/02/13 1049 08/03/13 0435 08/04/13 0520  CKTOTAL 14782*  --   --  8655* 5755* 4142*  CKMB 35.5*  --   --  10.7*  --   --   TROPONINI  --  <0.30 <0.30 <0.30  --   --   RELINDX 0.2  --   --  0.1  --   --    #3 Bipolar: will need to  address resuming Seroquel and Zoloft in the next day or so -appreciate Psych recommendations concerning psych med management  Dispo: Disposition is deferred at this time, awaiting improvement of current medical problems.  Anticipated discharge in approximately 2-3 day(s). He lives at and ALF. -consult SW for status of ALF bed -consult PT/OT  The patient does not have a current PCP (Pcp Not In System) and does need hospital follow-up appointment after discharge with [facility].  The patient does have transportation limitations that hinder transportation to clinic appointments.  .Services Needed at time of discharge: Y = Yes, Blank = No PT:   OT:   RN:   Equipment:   Other:     LOS: 3 days   Manuela Schwartz, MD 08/04/2013, 10:56 AM

## 2013-08-04 NOTE — Progress Notes (Signed)
Subjective: [name] feels much better today. He is sitting up in bed eating breakfast, able to carry on a full conversation, and appears to be in a good mood. He tells me that he was previously hospitalized at [facility] for psychiatric care. He denies any problems urinating. He still is not able to move his legs much and has not tried standing up. He was encouraged to sit in a chair and slowly increase activity as tolerated. He denies any SOB or pain anywhere.  Objective: Vital signs in last 24 hours: Filed Vitals:   08/03/13 1649 08/04/13 0028 08/04/13 0427 08/04/13 0808  BP:  157/97 146/75 145/81  Pulse: 85 89 82 87  Temp: 99.1 F (37.3 C) 99.5 F (37.5 C) 98.7 F (37.1 C) 98.5 F (36.9 C)  TempSrc: Oral Oral Axillary Oral  Resp: 18 27 25 26   Height:      Weight:      SpO2: 100% 100% 100% 100%   Weight change:   Intake/Output Summary (Last 24 hours) at 08/04/13 0913 Last data filed at 08/04/13 0600  Gross per 24 hour  Intake   3945 ml  Output   3825 ml  Net    120 ml   Physical Exam  Eyes: Pupils are equal, round, and reactive to light.  Cardiovascular: Normal rate, regular rhythm, S1 normal, S2 normal and normal heart sounds.  Pulmonary/Chest: Effort normal and breath sounds normal.  Abdominal: Soft. Bowel sounds are normal. There is no tenderness.  Musculoskeletal:  Continued improvement in rigidity in all 4 extremities Neurological: He is alert.  Cranial nerves are grossly intact. Motor: 4/5 BUE, 4-/5 BLE Sensory: 5/5 throughout DTRs: 2+ throughout Coordination and gait: unable to test  Skin: Skin is warm and dry.    Lab Results: Basic Metabolic Panel:  Recent Labs Lab 08/01/13 1930  08/03/13 0435 08/04/13 0520  NA  --   < > 137 139  K  --   < > 3.1* 3.3*  CL  --   < > 101 105  CO2  --   < > 26 25  GLUCOSE  --   < > 87 87  BUN  --   < > 30* 16  CREATININE  --   < > 1.11 0.90  CALCIUM  --   < > 8.7 8.7  PHOS 3.2  --   --   --   < > = values in this  interval not displayed. Liver Function Tests:  Recent Labs Lab 08/03/13 0435 08/04/13 0520  AST 150* 132*  ALT 68* 71*  ALKPHOS 60 59  BILITOT 0.2* 0.2*  PROT 6.6 6.1  ALBUMIN 3.0* 2.8*   CBC:  Recent Labs Lab 08/01/13 1402  08/03/13 0435 08/04/13 0520  WBC 11.4*  < > 4.8 4.7  NEUTROABS 8.3*  --   --   --   HGB 14.3  < > 9.8* 9.7*  HCT 40.9  < > 29.8* 28.8*  MCV 86.7  < > 89.0 87.5  PLT 205  < > 187 179  < > = values in this interval not displayed. Cardiac Enzymes:  Recent Labs Lab 08/01/13 1402 08/01/13 1915 08/02/13 0345 08/02/13 1049 08/03/13 0435 08/04/13 0520  CKTOTAL 09811*  --   --  8655* 5755* 4142*  CKMB 35.5*  --   --  10.7*  --   --   TROPONINI  --  <0.30 <0.30 <0.30  --   --    Micro Results: Recent Results (  from the past 240 hour(s))  GRAM STAIN     Status: None   Collection Time    08/01/13  5:17 PM      Result Value Range Status   Specimen Description CSF   Final   Special Requests 0.6ML FLUID   Final   Gram Stain     Final   Value: CYTOSPIN PREP     WBC PRESENT,BOTH PMN AND MONONUCLEAR     NO ORGANISMS SEEN     Gram Stain Report Called to,Read Back By and Verified With: RN Cyril Mourning, A 1822 08/01/13 Riki Rusk.   Report Status 08/01/2013 FINAL   Final  CSF CULTURE     Status: None   Collection Time    08/01/13  5:17 PM      Result Value Range Status   Specimen Description CSF   Final   Special Requests 0.6ML FLUID   Final   Gram Stain     Final   Value: WBC PRESENT,BOTH PMN AND MONONUCLEAR     NO ORGANISMS SEEN     CYTOSPIN Performed at [facility]     Performed at [facility]   Culture     Final   Value: NO GROWTH 2 DAYS     Note: Gram Stain Report Called to,Read Back By and Verified With: RN Braulio Conte 08/01/13 Riki Rusk     Performed at [facility]   Report Status PENDING   Incomplete  FUNGUS CULTURE W SMEAR     Status: None   Collection Time    08/01/13  5:17 PM      Result Value Range Status    Specimen Description CSF   Final   Special Requests 0.6ML FLUID   Final   Fungal Smear     Final   Value: NO YEAST OR FUNGAL ELEMENTS SEEN     Performed at [facility]   Culture     Final   Value: CULTURE IN PROGRESS FOR FOUR WEEKS     Performed at [facility]   Report Status PENDING   Incomplete  MRSA PCR SCREENING     Status: None   Collection Time    08/01/13 11:02 PM      Result Value Range Status   MRSA by PCR NEGATIVE  NEGATIVE Final   Comment:            The GeneXpert MRSA Assay (FDA     approved for NASAL specimens     only), is one component of a     comprehensive MRSA colonization     surveillance program. It is not     intended to diagnose MRSA     infection nor to guide or     monitor treatment for     MRSA infections.   Studies/Results: No results found. Medications: I have reviewed the patient's current medications. Scheduled Meds: . amLODipine  10 mg Oral Daily  . antiseptic oral rinse  15 mL Mouth Rinse q12n4p  . benztropine mesylate  1 mg Intravenous BID  . chlorhexidine  15 mL Mouth Rinse BID  . heparin  5,000 Units Subcutaneous Q8H  . levothyroxine  75 mcg Oral QAC breakfast  . LORazepam  1 mg Intravenous TID  . metoprolol tartrate  25 mg Oral BID  . sodium chloride  1,000 mL Intravenous Once  . sodium chloride  3 mL Intravenous Q12H   Continuous Infusions: . sodium chloride 150 mL/hr at 08/04/13  0503   PRN Meds:.sodium chloride, hydrALAZINE, sodium chloride  Assessment/Plan: [name] is a 52 yo man with a PMH of bipolar disorder who is admitted with likely NMS secondary to not receiving his medications [place].   # Neuroleptic Malignant Syndrome - [name] continues to improve. His mental status appears to have returned to baseline. His muscle rigidity has improved significantly and his reflexes have returned to normal. His vital signs continue to remain stable with no signs of hyperthermia or  autonomic dysfunction. He denies any SOB and has been satting at 100% so we will discontinue his O2 today. His blood work continues to improve and cultures remain negative. He required I&O cath x2 yesterday for inability to void with >1L on bladder scan; while it is possible that benztropine may contribute to urinary retention this is less likely as [name] was on benztropine as a home medication. Subjectively he denies any problems urinating. As he becomes more alert and starts ambulating we expect that he will be able to void on his own.  - Continue supportive therapy with IVF and close monitoring. Encourage ambulation.  - Continue q2 hr vitals  - Ativan 1mg  IV q6hr PRN to avoid oversedation  - Continue to monitor for electrolyte abnormalities including hypocalcemia, hypomagnesemia, hypo/hypernatremia, hyperkalemia, and metabolic acidosis  - Leukocytosis improving. Continue daily CBC.  - Cooling blankets if hyperthermia is evident  - Neurology and psych following, appreciate recs.   # Rhabdomyolysis - Mr. Hanawalt' rhabdo continues to improve. His electrolytes are improving and his potassium has been repleted. CK Total has improved from 15.7k > 8.7k > 5.8k > 4.1k. CK levels may never normalize after an episode of NMS.  - Continue to trend CK Total daily  - Continue IVF at 150 ml/hr (no cardiac hx on review of records)   # Acute Kidney Injury - His kidney function continues to improve. BUN and creatinine have improved from 62 > 48 > 30 > 16 and 1.73 > 1.28 > 1.11 > 0.90 respectively.  - Continue to monitor kidney function.  - Continue IVF as above.  - Strict I/Os   # Elevated Transaminases - Improving. See previous note.   # HTN - Stable. Since his kidney function has returned to baseline, we will consider restarting his home benazepril if he needs further control.   # Bipolar I - Unchanged. See previous note.   # Alcohol Abuse - Unchanged. See previous note.   Dispo: Disposition is  deferred at this time, awaiting improvement of current medical problems.   The patient does not have a current PCP (Pcp Not In System) and does need an [facility] hospital follow-up appointment after discharge.  The patient does not know have transportation limitations that hinder transportation to clinic appointments.   This is a Psychologist, occupational Note.  The care of the patient was discussed with Dr. Kem Kays and Dr. Bosie Clos and the assessment and plan formulated with their assistance.  Please see their attached note for official documentation of the daily encounter.   LOS: 3 days   Kerrie Pleasure, Med Student 08/04/2013, 9:13 AM

## 2013-08-05 LAB — COMPREHENSIVE METABOLIC PANEL
ALT: 84 U/L — ABNORMAL HIGH (ref 0–53)
AST: 127 U/L — ABNORMAL HIGH (ref 0–37)
Alkaline Phosphatase: 60 U/L (ref 39–117)
CO2: 26 mEq/L (ref 19–32)
Calcium: 9.2 mg/dL (ref 8.4–10.5)
Potassium: 3.4 mEq/L — ABNORMAL LOW (ref 3.5–5.1)
Sodium: 140 mEq/L (ref 135–145)
Total Protein: 6.3 g/dL (ref 6.0–8.3)

## 2013-08-05 LAB — CBC
HCT: 29.6 % — ABNORMAL LOW (ref 39.0–52.0)
Hemoglobin: 9.8 g/dL — ABNORMAL LOW (ref 13.0–17.0)
MCH: 28.9 pg (ref 26.0–34.0)
MCHC: 33.1 g/dL (ref 30.0–36.0)
Platelets: 193 10*3/uL (ref 150–400)

## 2013-08-05 MED ORDER — DOCUSATE SODIUM 100 MG PO CAPS
100.0000 mg | ORAL_CAPSULE | Freq: Every day | ORAL | Status: DC
  Administered 2013-08-06: 100 mg via ORAL
  Filled 2013-08-05 (×2): qty 1

## 2013-08-05 MED ORDER — BENAZEPRIL HCL 20 MG PO TABS
20.0000 mg | ORAL_TABLET | Freq: Every day | ORAL | Status: DC
  Administered 2013-08-05 – 2013-08-06 (×2): 20 mg via ORAL
  Filled 2013-08-05 (×2): qty 1

## 2013-08-05 MED ORDER — POTASSIUM CHLORIDE CRYS ER 20 MEQ PO TBCR
40.0000 meq | EXTENDED_RELEASE_TABLET | Freq: Once | ORAL | Status: AC
  Administered 2013-08-05: 40 meq via ORAL
  Filled 2013-08-05: qty 2

## 2013-08-05 MED ORDER — POLYETHYLENE GLYCOL 3350 17 G PO PACK
17.0000 g | PACK | Freq: Two times a day (BID) | ORAL | Status: DC
  Filled 2013-08-05 (×4): qty 1

## 2013-08-05 NOTE — Progress Notes (Signed)
Patient will not answer and is ignoring questions asked pertaining to home cpap settings. Placed cpap in autoflex mode and set up with a nasal mask. Patient refuses to wear at this time. RN aware

## 2013-08-05 NOTE — Progress Notes (Signed)
Physical Therapy Treatment Patient Details Name: Brenden Rudman MRN: [id number] DOB: Aug 30, 1961 Today's Date: 08/05/2013 Time: 4098-1191 PT Time Calculation (min): 25 min  PT Assessment / Plan / Recommendation  History of Present Illness Mr. 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   PT Comments   Patient making improvement with mobility, gait, and balance.  Follow Up Recommendations  Home health PT (at ALF if they can provide pt assistance with mobility initi)     Does the patient have the potential to tolerate intense rehabilitation     Barriers to Discharge        Equipment Recommendations  Rolling walker with 5" wheels    Recommendations for Other Services    Frequency Min 3X/week   Progress towards PT Goals Progress towards PT goals: Progressing toward goals  Plan Current plan remains appropriate    Precautions / Restrictions Precautions Precautions: Fall Restrictions Weight Bearing Restrictions: No   Pertinent Vitals/Pain     Mobility  Bed Mobility Bed Mobility: Supine to Sit;Sitting - Scoot to Edge of Bed Supine to Sit: 4: Min assist;HOB elevated Sitting - Scoot to [place] of Bed: 4: Min guard Details for Bed Mobility Assistance: Assist to initiate movement. Transfers Transfers: Sit to Stand;Stand to Sit Sit to Stand: 4: Min assist;With upper extremity assist;From bed Stand to Sit: 4: Min assist;With upper extremity assist;With armrests;To chair/3-in-1;To bed Details for Transfer Assistance: Verbal and tactile cues for hand placement.  Cues to scoot to EOB before standing.  Assist for balance/safety.  Patient able to power up to standing.  Practiced sit<> stand x3. Ambulation/Gait Ambulation/Gait Assistance: 4: Min assist Ambulation Distance (Feet): 200 Feet Assistive device: Rolling walker Ambulation/Gait  Assistance Details: Verbal cues for safe use of RW. Cues to keep feet inside RW.  Assist to maneuver RW and for safety during turns. Gait Pattern: Step-through pattern;Decreased stride length;Narrow base of support Gait velocity: decr      PT Goals (current goals can now be found in the care plan section)    Visit Information  Last PT Received On: 08/05/13 Assistance Needed: +1 History of Present Illness: Mr. 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    Subjective Data  Subjective: "I'm OK"   Cognition  Cognition Arousal/Alertness: Awake/alert Behavior During Therapy: WFL for tasks assessed/performed Overall Cognitive Status: No family/caregiver present to determine baseline cognitive functioning (Patient with slow response time)    Balance     End of Session PT - End of Session Equipment Utilized During Treatment: Gait belt Activity Tolerance: Patient tolerated treatment well Patient left: in chair;with call bell/phone within reach Nurse Communication: Mobility status   GP     Jalani, Rominger 08/05/2013, 11:51 AM Durenda Hurt. Renaldo Fiddler, [facility] Acute Rehab Services Pager [phone]

## 2013-08-05 NOTE — Progress Notes (Signed)
Subjective: Pt is without complaints, requested CPAP overnight as he states that he uses it at home  Objective: Vital signs in last 24 hours: Filed Vitals:   08/05/13 0121 08/05/13 0436 08/05/13 0818 08/05/13 1120  BP: 171/79 152/79 173/96 150/96  Pulse: 75 60 73   Temp: 98.5 F (36.9 C) 98 F (36.7 C) 98.2 F (36.8 C) 98.5 F (36.9 C)  TempSrc: Oral Axillary Axillary Axillary  Resp: 27 25 14    Height:      Weight:      SpO2: 80% 100% 97% 100%   Weight change:   Intake/Output Summary (Last 24 hours) at 08/05/13 1254 Last data filed at 08/05/13 0700  Gross per 24 hour  Intake   4440 ml  Output   5802 ml  Net  -1362 ml   Physical Exam: General: Well-developed, well-nourished, AA male, in no acute distress; sitting upright in bed Head: Normocephalic, atraumatic, nasal canula in place Eyes: PERRLA, EOMI, murky sclera Neck: supple Lungs: Normal respiratory effort. Clear to auscultation bilaterally from apices to bases without crackles or wheezes appreciated. Heart: normal rate, regular rhythm, normal S1 and S2, no gallop, murmur, or rubs appreciated. Abdomen: BS normoactive. Soft, Nondistended, non-tender. No masses or organomegaly appreciated. Extremities: No pretibial edema, distal pulses intact, pt able to move all extremities with good ROM, strength 5/5 throughout Neurologic: grossly non-focal, alert and oriented x3, appropriate and cooperative throughout examination. Psych: appropriate mood, will smile and had good eye-contact but affect appears blunted otherwise    Lab Results: Basic Metabolic Panel:  Recent Labs Lab 08/01/13 1930  08/04/13 0520 08/05/13 0410  NA  --   < > 139 140  K  --   < > 3.3* 3.4*  CL  --   < > 105 104  CO2  --   < > 25 26  GLUCOSE  --   < > 87 94  BUN  --   < > 16 10  CREATININE  --   < > 0.90 0.81  CALCIUM  --   < > 8.7 9.2  PHOS 3.2  --   --   --   < > = values in this interval not displayed. Liver Function Tests:  Recent  Labs Lab 08/04/13 0520 08/05/13 0410  AST 132* 127*  ALT 71* 84*  ALKPHOS 59 60  BILITOT 0.2* 0.2*  PROT 6.1 6.3  ALBUMIN 2.8* 2.8*    Recent Labs Lab 08/01/13 1402  AMMONIA 40   CBC:  Recent Labs Lab 08/01/13 1402  08/04/13 0520 08/05/13 0410  WBC 11.4*  < > 4.7 4.2  NEUTROABS 8.3*  --   --   --   HGB 14.3  < > 9.7* 9.8*  HCT 40.9  < > 28.8* 29.6*  MCV 86.7  < > 87.5 87.3  PLT 205  < > 179 193  < > = values in this interval not displayed. Cardiac Enzymes:  Recent Labs Lab 08/01/13 1402 08/01/13 1915 08/02/13 0345 08/02/13 1049 08/03/13 0435 08/04/13 0520 08/05/13 0410  CKTOTAL 16109*  --   --  8655* 5755* 4142* 3392*  CKMB 35.5*  --   --  10.7*  --   --   --   TROPONINI  --  <0.30 <0.30 <0.30  --   --   --    CBG:  Recent Labs Lab 08/02/13 0919 08/03/13 0839 08/04/13 0817 08/05/13 0814  GLUCAP 106* 130* 88 86   Thyroid Function Tests:  Recent  Labs Lab 08/01/13 1638  TSH 6.108*   Coagulation:  Recent Labs Lab 08/01/13 1930  LABPROT 15.3*  INR 1.24   Anemia Panel:  Recent Labs Lab 08/01/13 1930  FERRITIN 294   Urine Drug Screen: Drugs of Abuse     Component Value Date/Time   LABOPIA NONE DETECTED 08/01/2013 1458   COCAINSCRNUR NONE DETECTED 08/01/2013 1458   LABBENZ POSITIVE* 08/01/2013 1458   AMPHETMU NONE DETECTED 08/01/2013 1458   THCU NONE DETECTED 08/01/2013 1458   LABBARB NONE DETECTED 08/01/2013 1458    Urinalysis:  Recent Labs Lab 08/01/13 1458  COLORURINE YELLOW  LABSPEC 1.020  PHURINE 5.0  GLUCOSEU NEGATIVE  HGBUR MODERATE*  BILIRUBINUR NEGATIVE  KETONESUR NEGATIVE  PROTEINUR >300*  UROBILINOGEN 0.2  NITRITE NEGATIVE  LEUKOCYTESUR NEGATIVE     Micro Results: Recent Results (from the past 240 hour(s))  GRAM STAIN     Status: None   Collection Time    08/01/13  5:17 PM      Result Value Range Status   Specimen Description CSF   Final   Special Requests 0.6ML FLUID   Final   Gram Stain     Final    Value: CYTOSPIN PREP     WBC PRESENT,BOTH PMN AND MONONUCLEAR     NO ORGANISMS SEEN     Gram Stain Report Called to,Read Back By and Verified With: RN LARSON, A 1822 08/01/13 Riki Rusk.   Report Status 08/01/2013 FINAL   Final  CSF CULTURE     Status: None   Collection Time    08/01/13  5:17 PM      Result Value Range Status   Specimen Description CSF   Final   Special Requests 0.6ML FLUID   Final   Gram Stain     Final   Value: WBC PRESENT,BOTH PMN AND MONONUCLEAR     NO ORGANISMS SEEN     CYTOSPIN Performed at [facility]     Performed at [facility]   Culture     Final   Value: NO GROWTH 3 DAYS     Note: Gram Stain Report Called to,Read Back By and Verified With: RN Braulio Conte 08/01/13 Riki Rusk     Performed at [facility]   Report Status 08/04/2013 FINAL   Final  FUNGUS CULTURE W SMEAR     Status: None   Collection Time    08/01/13  5:17 PM      Result Value Range Status   Specimen Description CSF   Final   Special Requests 0.6ML FLUID   Final   Fungal Smear     Final   Value: NO YEAST OR FUNGAL ELEMENTS SEEN     Performed at [facility]   Culture     Final   Value: CULTURE IN PROGRESS FOR FOUR WEEKS     Performed at [facility]   Report Status PENDING   Incomplete  MRSA PCR SCREENING     Status: None   Collection Time    08/01/13 11:02 PM      Result Value Range Status   MRSA by PCR NEGATIVE  NEGATIVE Final   Comment:            The GeneXpert MRSA Assay (FDA     approved for NASAL specimens     only), is one component of a     comprehensive MRSA colonization     surveillance program. It is not  intended to diagnose MRSA     infection nor to guide or     monitor treatment for     MRSA infections.   Studies/Results: No results found. Medications: I have reviewed the patient's current medications. Scheduled Meds: . amLODipine  10 mg Oral Daily  . benazepril  20 mg Oral Daily  . benztropine mesylate  1 mg  Intravenous BID  . docusate sodium  100 mg Oral Daily  . heparin  5,000 Units Subcutaneous Q8H  . levothyroxine  75 mcg Oral QAC breakfast  . LORazepam  1 mg Intravenous TID  . metoprolol tartrate  25 mg Oral BID  . polyethylene glycol  17 g Oral BID  . sodium chloride  1,000 mL Intravenous Once  . sodium chloride  3 mL Intravenous Q12H   Continuous Infusions: . sodium chloride 1,000 mL (08/05/13 0752)   PRN Meds:.sodium chloride, hydrALAZINE, ondansetron (ZOFRAN) IV, ondansetron, sodium chloride  Assessment/Plan: #HD 4 for Mr. Mattie admitted with early neuroleptic malignant syndrome secondary to abrupt cessation of anticholinergic agents in setting of long-term dopaminergic medications including Haldol.   # 1. Neuroleptic Malignant Syndrome: mild, improving, close to baseline, no hyperthermia  -cont Congentin  -encourage ambulation , cont PT -cont IVF therapy  -trf to medsurg  #2 Rhabdomyolysis: Ck trending downward to 3K today, creatinine back to baseline  -cont IVF therapy until CK below 1K  Cardiac Panel (last 3 results)  Recent Labs  08/03/13 0435 08/04/13 0520 08/05/13 0410  CKTOTAL 5755* 4142* 3392*    #3 Bipolar: appears stable without no manic or depressive symptoms evident, will need to address resuming Seroquel and Zoloft in the next day or so  -appreciate Psych recommendations concerning psych med management  #4 hypertension: above goal on metoprolol 25 mg bid and amlodipine 10 mg qd -will resume home regimen of benazepril 20 mg qd  #5 Obstructive Sleep Apnea: CPAP at home, O2 went down to 80% while sleeping on room air and increased to 100 after CPAP -cont CPAP during hospitalization  Dispo: Likely discharge to ALF tomorrow with Home Health PT.  Anticipated discharge in approximately 1-2 day(s).   The patient does not have a current PCP (Pcp Not In System) and does need hospital follow-up appointment after discharge with [facility].  The  patient does have transportation limitations that hinder transportation to clinic appointments.  .Services Needed at time of discharge: Y = Yes, Blank = No PT: Y  OT:   RN:   Equipment: wheeled walker  Other:     LOS: 4 days   Manuela Schwartz, MD 08/05/2013, 12:54 PM

## 2013-08-06 LAB — COMPREHENSIVE METABOLIC PANEL
AST: 100 U/L — ABNORMAL HIGH (ref 0–37)
BUN: 11 mg/dL (ref 6–23)
CO2: 27 mEq/L (ref 19–32)
Calcium: 9.6 mg/dL (ref 8.4–10.5)
Chloride: 100 mEq/L (ref 96–112)
Creatinine, Ser: 0.9 mg/dL (ref 0.50–1.35)
GFR calc Af Amer: >90 mL/min (ref >90)
GFR calc non Af Amer: >90 mL/min (ref >90)
Glucose, Bld: 86 mg/dL (ref 70–99)
Total Bilirubin: 0.3 mg/dL (ref 0.3–1.2)
Total Protein: 6.9 g/dL (ref 6.0–8.3)

## 2013-08-06 LAB — GLUCOSE, CAPILLARY: Glucose-Capillary: 77 mg/dL (ref 70–99)

## 2013-08-06 LAB — CBC
HCT: 33.2 % — ABNORMAL LOW (ref 39.0–52.0)
Hemoglobin: 11.1 g/dL — ABNORMAL LOW (ref 13.0–17.0)
MCHC: 33.4 g/dL (ref 30.0–36.0)
MCV: 88.3 fL (ref 78.0–100.0)
Platelets: 212 10*3/uL (ref 150–400)
RBC: 3.76 MIL/uL — ABNORMAL LOW (ref 4.22–5.81)
RDW: 14.2 % (ref 11.5–15.5)

## 2013-08-06 NOTE — Progress Notes (Signed)
Subjective: [name] feels well this morning. He has been ambulating to the bathroom and no longer is using his catheter to void. He slept well last night with his CPAP. He still feels stiff but says it has been much easier to move. He states that he has a psychiatrist that comes weekly and sees him at [facility]. He has a sister who lives in [place] who visits him several times a week and helps him with his medications and health care.   Objective: Vital signs in last 24 hours: Filed Vitals:   08/05/13 1539 08/05/13 2340 08/05/13 2345 08/06/13 0953  BP: 170/83  139/94 122/75  Pulse: 69 70 88 67  Temp: 97.6 F (36.4 C)  99.1 F (37.3 C)   TempSrc: Oral  Oral   Resp: 18 18 16    Height:      Weight:      SpO2: 96% 98% 95%    Weight change:   Intake/Output Summary (Last 24 hours) at 08/06/13 1145 Last data filed at 08/05/13 2125  Gross per 24 hour  Intake   1050 ml  Output   1900 ml  Net   -850 ml    Physical Exam  Eyes: Pupils are equal, round, and reactive to light.  Cardiovascular: Normal rate, regular rhythm, S1 normal, S2 normal and normal heart sounds.  Pulmonary/Chest: Effort normal and breath sounds normal.  Abdominal: Soft. Bowel sounds are normal. There is no tenderness.  Musculoskeletal:  Continued improvement in rigidity in all 4 extremities Neurological: He is alert.  Cranial nerves are grossly intact. Motor: 4+/5 BUE, 4/5 BLE Sensory: 5/5 throughout DTRs: 2+ throughout Skin: Skin is warm and dry.    Lab Results: Basic Metabolic Panel:  Recent Labs Lab 08/01/13 1930  08/05/13 0410 08/06/13 0440  NA  --   < > 140 137  K  --   < > 3.4* 3.9  CL  --   < > 104 100  CO2  --   < > 26 27  GLUCOSE  --   < > 94 86  BUN  --   < > 10 11  CREATININE  --   < > 0.81 0.90  CALCIUM  --   < > 9.2 9.6  PHOS 3.2  --   --   --   < > = values in this interval not displayed. Liver Function Tests:  Recent Labs Lab 08/05/13 0410 08/06/13 0440  AST  127* 100*  ALT 84* 82*  ALKPHOS 60 63  BILITOT 0.2* 0.3  PROT 6.3 6.9  ALBUMIN 2.8* 3.2*   CBC:  Recent Labs Lab 08/01/13 1402  08/05/13 0410 08/06/13 0440  WBC 11.4*  < > 4.2 4.2  NEUTROABS 8.3*  --   --   --   HGB 14.3  < > 9.8* 11.1*  HCT 40.9  < > 29.6* 33.2*  MCV 86.7  < > 87.3 88.3  PLT 205  < > 193 212  < > = values in this interval not displayed. Cardiac Enzymes:  Recent Labs Lab 08/01/13 1402 08/01/13 1915 08/02/13 0345 08/02/13 1049  08/04/13 0520 08/05/13 0410 08/06/13 0440  CKTOTAL 16109*  --   --  8655*  < > 4142* 3392* 2358*  CKMB 35.5*  --   --  10.7*  --   --   --   --   TROPONINI  --  <0.30 <0.30 <0.30  --   --   --   --   < > =  values in this interval not displayed.  Micro Results: Recent Results (from the past 240 hour(s))  GRAM STAIN     Status: None   Collection Time    08/01/13  5:17 PM      Result Value Range Status   Specimen Description CSF   Final   Special Requests 0.6ML FLUID   Final   Gram Stain     Final   Value: CYTOSPIN PREP     WBC PRESENT,BOTH PMN AND MONONUCLEAR     NO ORGANISMS SEEN     Gram Stain Report Called to,Read Back By and Verified With: RN LARSON, A 1822 08/01/13 Riki Rusk.   Report Status 08/01/2013 FINAL   Final  CSF CULTURE     Status: None   Collection Time    08/01/13  5:17 PM      Result Value Range Status   Specimen Description CSF   Final   Special Requests 0.6ML FLUID   Final   Gram Stain     Final   Value: WBC PRESENT,BOTH PMN AND MONONUCLEAR     NO ORGANISMS SEEN     CYTOSPIN Performed at [facility]     Performed at [facility]   Culture     Final   Value: NO GROWTH 3 DAYS     Note: Gram Stain Report Called to,Read Back By and Verified With: RN Braulio Conte 08/01/13 Riki Rusk     Performed at [facility]   Report Status 08/04/2013 FINAL   Final  FUNGUS CULTURE W SMEAR     Status: None   Collection Time    08/01/13  5:17 PM      Result Value Range Status   Specimen  Description CSF   Final   Special Requests 0.6ML FLUID   Final   Fungal Smear     Final   Value: NO YEAST OR FUNGAL ELEMENTS SEEN     Performed at [facility]   Culture     Final   Value: CULTURE IN PROGRESS FOR FOUR WEEKS     Performed at [facility]   Report Status PENDING   Incomplete  MRSA PCR SCREENING     Status: None   Collection Time    08/01/13 11:02 PM      Result Value Range Status   MRSA by PCR NEGATIVE  NEGATIVE Final   Comment:            The GeneXpert MRSA Assay (FDA     approved for NASAL specimens     only), is one component of a     comprehensive MRSA colonization     surveillance program. It is not     intended to diagnose MRSA     infection nor to guide or     monitor treatment for     MRSA infections.   Studies/Results: No results found. Medications: I have reviewed the patient's current medications. Scheduled Meds: . amLODipine  10 mg Oral Daily  . benazepril  20 mg Oral Daily  . benztropine mesylate  1 mg Intravenous BID  . docusate sodium  100 mg Oral Daily  . heparin  5,000 Units Subcutaneous Q8H  . levothyroxine  75 mcg Oral QAC breakfast  . metoprolol tartrate  25 mg Oral BID  . polyethylene glycol  17 g Oral BID  . sodium chloride  3 mL Intravenous Q12H   Continuous Infusions:  PRN Meds:.sodium chloride, hydrALAZINE, ondansetron (  ZOFRAN) IV, ondansetron, sodium chloride   Assessment/Plan: [name] is a 52 yo man with a PMH of bipolar disorder who is admitted with likely NMS secondary to not receiving his medications [place], now HD#5 and significantly improved.   # Neuroleptic Malignant Syndrome - Mr. Mich continues to improve and appears close to his baseline. His muscle rigidity has improved significantly and his reflexes have returned to normal. His vital signs continue to remain stable with no signs of hyperthermia or autonomic dysfunction. He denies any SOB and has been satting well on  room air. His blood work continues to improve and cultures remain negative. He required I&O cath x2 over the weekend for urinary retention but this seems to be improving. It is possible that benztropine may contribute to urinary retention though this is less likely as Mr. Lamb was on benztropine as a home medication and never experienced problems with urinary retention before. It is more likely that his change in mental status and muscle rigidity contributed to this problem. Subjectively he denies any problems urinating today. He has been ambulating to the bathroom and is doing well after his catheter has been discontinued. He has been evaluated by PT and was recommended to have home health PT at the ALF to help him with mobility initially.  - Continue supportive therapy with IVF and close monitoring. Encourage ambulation.  - Leukocytosis improving.  - Cooling blankets if hyperthermia is evident  - Neurology and psych following, appreciate recs.  - Anticipated discharge today. He has follow-up with primary care and psychiatry at [facility].  # Rhabdomyolysis - Mr. Sooy' rhabdo continues to improve. His electrolytes are improving and his potassium has been repleted. CK Total has improved from 15.7k > 8.7k > 5.8k > 4.1k > 3.4k > 2.4k. CK levels may never normalize after an episode of NMS.  - Continue IVF at 150 ml/hr (no cardiac hx on review of records)   # Acute Kidney Injury - His kidney function has returned to baseline.  - Continue to monitor kidney function.  - Continue IVF as above.  - Strict I/Os   # Elevated Transaminases - Improving. See previous note.   # HTN - Stable on home medications. See previous note.  # Bipolar I - Unchanged. See previous note.   # Alcohol Abuse - Unchanged. See previous note.   Dispo: Anticipated discharge today.  The patient does not have a current PCP (Pcp Not In System) and does need an [facility] hospital follow-up appointment after discharge.   The  patient does not know have transportation limitations that hinder transportation to clinic appointments.  This is a Psychologist, occupational Note.  The care of the patient was discussed with Dr. Orvan Falconer and Dr. Bosie Clos and the assessment and plan formulated with their assistance.  Please see their attached note for official documentation of the daily encounter.   LOS: 5 days   Kerrie Pleasure, Med Student 08/06/2013, 11:45 AM

## 2013-08-06 NOTE — Progress Notes (Signed)
Patient d/c this evening back to assisted living facility.  IV removed.  Report called.

## 2013-08-06 NOTE — Progress Notes (Signed)
Physical Therapy Treatment Patient Details Name: Belvin Gauss MRN: [id number] DOB: 1961-10-18 Today's Date: 08/06/2013 Time:  -     PT Assessment / Plan / Recommendation  History of Present Illness Mr. Termini is a 52 yo man with a PMH of bipolar disorder who is admitted with 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   PT Comments   Pt progressing well with mobility.  Ambulated entire unit without an AD & performed various gait challenges without LOB noted.  Cont to recommend HHPT at ALF for higher level balance activities & increase safety with mobility.     Follow Up Recommendations  Home health PT (at ALF if they provide pt assistance with mobility initially)     Does the patient have the potential to tolerate intense rehabilitation     Barriers to Discharge        Equipment Recommendations       Recommendations for Other Services    Frequency Min 3X/week   Progress towards PT Goals Progress towards PT goals: Progressing toward goals  Plan Current plan remains appropriate    Precautions / Restrictions Precautions Precautions: Fall Restrictions Weight Bearing Restrictions: No       Mobility  Bed Mobility Bed Mobility: Supine to Sit;Sitting - Scoot to Edge of Bed Supine to Sit: 5: Supervision Sitting - Scoot to Edge of Bed: 5: Supervision Transfers Transfers: Sit to Stand;Stand to Sit Sit to Stand: 5: Supervision;From bed;With upper extremity assist Stand to Sit: 5: Supervision;With upper extremity assist;To bed Ambulation/Gait Ambulation/Gait Assistance: 4: Min guard Ambulation Distance (Feet): 400 Feet Assistive device: None Ambulation/Gait Assistance Details: Pt ambulated entire unit without an AD.  Performed various gait challenges such as gait speed changes, head turns in all directions, directional changes, & high stepping.  No LOB noted.  Pt does ambulate with mild limp due to Rt hip  pain Gait Pattern: Step-through pattern;Antalgic Stairs: No Wheelchair Mobility Wheelchair Mobility: No      PT Goals (current goals can now be found in the care plan section) Acute Rehab PT Goals Patient Stated Goal: return to [facility] PT Goal Formulation: With patient Time For Goal Achievement: 08/11/13 Potential to Achieve Goals: Good  Visit Information  Last PT Received On: 08/06/13 Assistance Needed: +1 History of Present Illness: Mr. Seufert is a 52 yo man with a PMH of bipolar disorder who is admitted with 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    Subjective Data  Subjective: "Im supposed to be getting out of here today" Patient Stated Goal: return to [facility]  Cognition Arousal/Alertness: Awake/alert Behavior During Therapy: [facility] for tasks assessed/performed Overall Cognitive Status: No family/caregiver present to determine baseline cognitive functioning    Balance     End of Session PT - End of Session Equipment Utilized During Treatment: Gait belt Activity Tolerance: Patient tolerated treatment well Patient left: in bed;with call bell/phone within reach;with bed alarm set Nurse Communication: Mobility status   GP     Lara Mulch 08/06/2013, 1:51 PM   Verdell Face, PTA [phone] 08/06/2013

## 2013-08-06 NOTE — Progress Notes (Signed)
Patient allowed RN to place him on cpap once he received his meds.

## 2013-08-06 NOTE — Evaluation (Addendum)
Occupational Therapy Evaluation Patient Details Name: Bartow Zylstra MRN: [id number] DOB: 1961-03-06 Today's Date: 08/06/2013 Time: 9629-5284 OT Time Calculation (min): 25 min  OT Assessment / Plan / Recommendation History of present illness Mr. 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   Clinical Impression   Pt presents with below problem list. Pt will benefit from acute OT to increase independence with ADLs. OT provided education to patient. Pt planning to d/c to ALF.   OT Assessment  Patient needs continued OT Services    Follow Up Recommendations  No OT follow up;Supervision/Assistance - 24 hour    Barriers to Discharge      Equipment Recommendations  None recommended by OT    Recommendations for Other Services    Frequency  Min 2X/week    Precautions / Restrictions Precautions Precautions: Fall Restrictions Weight Bearing Restrictions: No   Pertinent Vitals/Pain Reports he has pain in right hip sometimes.     ADL  Grooming: Set up Where Assessed - Grooming: Unsupported sitting Upper Body Bathing: Set up Where Assessed - Upper Body Bathing: Unsupported sitting Lower Body Bathing: Minimal assistance Where Assessed - Lower Body Bathing: Unsupported sit to stand Upper Body Dressing: Set up Where Assessed - Upper Body Dressing: Unsupported sitting Lower Body Dressing: Minimal assistance Where Assessed - Lower Body Dressing: Unsupported sit to stand Toilet Transfer: Supervision/safety Toilet Transfer Method: Sit to Barista: Regular height toilet Tub/Shower Transfer: Simulated;Min guard Tub/Shower Transfer Method: Science writer: Shower seat with back Equipment Used: Gait belt;Sock aid;Long-handled sponge Transfers/Ambulation Related to ADLs: Min guard for ambulation;  Supervision for transfers. ADL Comments: Assistance to don left sock-seemed to have limited ROM in LLE making task difficulty. Also, when simulated LB bathing, pt having hard time reaching left foot, so long handled sponge given to him from supply. Educated and had pt practice with sock aid, but pt not interested and said he will not use it. Educated to sit to bathe legs/feet.  Pt had weakness in both arms and grip strength.  Stepped back in later and gave pt theraband and explained/demonstrated. Also, educated on chair push ups to help increase strength in UEs. Pt asking to take a break after ambulating to bathroom and practicing toilet transfer.       OT Diagnosis: Generalized weakness  OT Problem List: Decreased strength;Decreased activity tolerance;Impaired balance (sitting and/or standing);Decreased range of motion;Decreased knowledge of use of DME or AE;Pain OT Treatment Interventions: Self-care/ADL training;Therapeutic exercise;DME and/or AE instruction;Therapeutic activities;Patient/family education;Balance training   OT Goals(Current goals can be found in the care plan section) Acute Rehab OT Goals Patient Stated Goal: not stated OT Goal Formulation: With patient Time For Goal Achievement: 08/13/13 Potential to Achieve Goals: Good ADL Goals Pt Will Perform Upper Body Bathing: with modified independence;sitting Pt Will Perform Lower Body Bathing: with modified independence;sit to/from stand Pt Will Transfer to Toilet: with modified independence;ambulating;regular height toilet;grab bars Pt Will Perform Toileting - Clothing Manipulation and hygiene: with modified independence;sit to/from stand Additional ADL Goal #1: Pt will be independent with HEP for UE's to increase strength.   Visit Information  Last OT Received On: 08/06/13 Assistance Needed: +1 History of Present Illness: Mr. Vanover is a 52 yo man with a PMH of bipolar disorder who is admitted with likely Neuroleptic malignant  syndrome secondary to not receiving his  medications [place].       Prior Functioning     Home Living Family/patient expects to be discharged to:: Assisted living Home Equipment: Shower seat;Grab bars - toilet Additional Comments: reports he has standard commode and uses walk in shower  Prior Function Level of Independence: Independent Comments: Amb independently at ALF. Communication Communication: No difficulties         Vision/Perception Vision - History Patient Visual Report: No change from baseline   Cognition  Cognition Arousal/Alertness: Awake/alert Behavior During Therapy: WFL for tasks assessed/performed Overall Cognitive Status: No family/caregiver present to determine baseline cognitive functioning    Extremity/Trunk Assessment Upper Extremity Assessment Upper Extremity Assessment: RUE deficits/detail;Generalized weakness RUE Deficits / Details: slightly less than full ROM in right shoulder with AROM;reports arthritis; weakness in grip as well as shoulder flexion in both UEs Lower Extremity Assessment Lower Extremity Assessment: Defer to PT evaluation     Mobility Bed Mobility Bed Mobility: Supine to Sit Supine to Sit: 6: Modified independent (Device/Increase time) Transfers Transfers: Sit to Stand;Stand to Sit Sit to Stand: 5: Supervision;From bed;From chair/3-in-1;From toilet Stand to Sit: 5: Supervision;To chair/3-in-1;To toilet     Exercise     Balance     End of Session OT - End of Session Equipment Utilized During Treatment: Gait belt Activity Tolerance: Patient tolerated treatment well Patient left: in chair;with call bell/phone within reach Nurse Communication: Other (comment) (pt sitting in chair)  GO     Earlie Raveling OTR/L [phone] 08/06/2013, 5:14 PM

## 2013-08-06 NOTE — Progress Notes (Signed)
   CARE MANAGEMENT NOTE 08/06/2013  Patient:  [name]   Account Number:  [id number]  Date Initiated:  08/06/2013  Documentation initiated by:  [facility]  Subjective/Objective Assessment:   Altered mental status     Action/Plan:   d/c back to [facility]   Anticipated DC Date:  08/06/2013   Anticipated DC Plan:  ASSISTED LIVING / REST HOME      DC Planning Services  CM consult      [facility] Choice  HOME HEALTH   Choice offered to / List presented to:  C-1 Patient   DME arranged  [facility]      DME agency  [facility]     HH arranged  HH-2 PT      HH agency  [facility]   Status of service:  Completed, signed off Medicare Important Message given?   (If response is "NO", the following Medicare IM given date fields will be blank) Date Medicare IM given:   Date Additional Medicare IM given:    Discharge Disposition:  ASSISTED LIVING  Per UR Regulation:    If discussed at Long Length of Stay Meetings, dates discussed:    Comments:  08/06/2013 1530 NCM spoke to pt and offered choice for [facility]. Pt requested [facility] for [facility]. He also requested RW. Contacted [facility] rep for RW. Isidoro Donning RN CCM Case Mgmt phone [phone]

## 2013-08-06 NOTE — Progress Notes (Signed)
[name] will return to [facility] via ambulance. His fl-2 and paperwork were sent to [facility]. The ambulance was called for transport. RN will call to give report.  Gretta Cool, LCSW Assisted Director Clinical Social Work [phone]

## 2013-08-06 NOTE — Discharge Summary (Signed)
Name: [name] MRN: [id number] DOB: Mar 15, 1961 52 y.o. PCP: Pcp Not In System  Date of Admission: 08/01/2013  1:22 PM Date of Discharge: 08/06/2013 Attending Physician: Cliffton Asters, MD  Discharge Diagnosis: Principal Problem:   Altered mental status Active Problems:   HTN (hypertension)   Bipolar 1 disorder   Rhabdomyolysis   Elevated transaminase level  Discharge Medications:   Medication List    ASK your doctor about these medications       acetaminophen 325 MG tablet  Commonly known as:  TYLENOL  Take 650 mg by mouth every 6 (six) hours as needed for pain.     amLODipine 10 MG tablet  Commonly known as:  NORVASC  Take 10 mg by mouth daily.     aspirin 325 MG tablet  Take 325 mg by mouth daily. Take 30 min prior to Niaspan.     benazepril 20 MG tablet  Commonly known as:  LOTENSIN  Take 20 mg by mouth daily.     benztropine 1 MG tablet  Commonly known as:  COGENTIN  Take 1 mg by mouth 2 (two) times daily.     cholecalciferol 1000 UNITS tablet  Commonly known as:  VITAMIN D  Take 1,000 Units by mouth daily.     divalproex 500 MG 24 hr tablet  Commonly known as:  DEPAKOTE ER  Take 500 mg by mouth 2 (two) times daily.     DSS 100 MG Caps  Take 200 mg by mouth 2 (two) times daily.     docusate sodium 100 MG capsule  Commonly known as:  COLACE  Take 200 mg by mouth 2 (two) times daily.     ferrous sulfate 325 (65 FE) MG tablet  Take 325 mg by mouth 2 (two) times daily.     guaifenesin 100 MG/5ML syrup  Commonly known as:  ROBITUSSIN  Take 200 mg by mouth 4 (four) times daily as needed for cough or congestion.     haloperidol decanoate 50 MG/ML injection  Commonly known as:  HALDOL DECANOATE  Inject 75 mg into the muscle every 28 (twenty-eight) days.     hydrochlorothiazide 25 MG tablet  Commonly known as:  HYDRODIURIL  Take 25 mg by mouth daily.     levothyroxine 75 MCG tablet  Commonly known as:  SYNTHROID, LEVOTHROID  Take 75 mcg by  mouth daily.     loperamide 2 MG capsule  Commonly known as:  IMODIUM  Take 2 mg by mouth 4 (four) times daily as needed for diarrhea or loose stools.     LORazepam 1 MG tablet  Commonly known as:  ATIVAN  Take 1 mg by mouth 3 (three) times daily.     LORazepam 2 MG tablet  Commonly known as:  ATIVAN  Take 1 mg by mouth every 4 (four) hours as needed (for aggitation). Max: 2 doses in 24 hours     magnesium hydroxide 400 MG/5ML suspension  Commonly known as:  MILK OF MAGNESIA  Take 30 mLs by mouth daily as needed for constipation.     meloxicam 7.5 MG tablet  Commonly known as:  MOBIC  Take 7.5 mg by mouth daily.     metoprolol tartrate 25 MG tablet  Commonly known as:  LOPRESSOR  Take 25 mg by mouth 2 (two) times daily.     multivitamin-iron-minerals-folic acid Tabs tablet  Take 1 tablet by mouth daily.     niacin 750 MG CR tablet  Commonly known as:  NIASPAN  Take 750 mg by mouth daily.     omeprazole 20 MG capsule  Commonly known as:  PRILOSEC  Take 20 mg by mouth daily.     polyethylene glycol packet  Commonly known as:  MIRALAX / GLYCOLAX  Take 17 g by mouth daily.     potassium chloride 10 MEQ tablet  Commonly known as:  K-DUR  Take 10 mEq by mouth daily.     pseudoephedrine 30 MG tablet  Commonly known as:  SUDAFED  Take 30 mg by mouth every 6 (six) hours as needed for congestion.     QUEtiapine 300 MG tablet  Commonly known as:  SEROQUEL  Take 300 mg by mouth 2 (two) times daily.     senna 8.6 MG Tabs tablet  Commonly known as:  SENOKOT  Take 1 tablet by mouth daily as needed (for constipation).     sertraline 100 MG tablet  Commonly known as:  ZOLOFT  Take 150 mg by mouth daily.     simvastatin 40 MG tablet  Commonly known as:  ZOCOR  Take 40 mg by mouth every evening.     tobramycin-dexamethasone ophthalmic solution  Commonly known as:  TOBRADEX  Place 1 drop into both eyes 4 (four) times daily.     zolpidem 10 MG tablet  Commonly known  as:  AMBIEN  Take 10 mg by mouth at bedtime as needed for sleep.        Disposition and follow-up:   [name] was discharged from [facility] in Stable condition.  At the hospital follow up visit please address:  1. Continued symptomatic resolution of neuroleptic malignant syndrome, any issues with urinary retention, and please re-establish the importance of medication adherence. 2. Please address resuming his psychiatric medications and future risk of developing [name]. 3. Labs / imaging needed at time of follow-up: total CK  4. Pending labs/ test needing follow-up: none  Follow-up Appointments:     Follow-up Information   Follow up with [facility] On 08/07/2013. (psychiatry appointment at 2 pm)    Contact information:   [address] [place], [place] [address] [phone]      Follow up with [facility]  On 08/10/2013. (primary care appointment at 12 pm)    Contact information:   [address] [place], [place] [address] [phone]      Discharge Instructions:   Consultations: Treatment Team:  Nehemiah Settle, MD  Procedures Performed:  Ct Head Wo Contrast  08/01/2013   *RADIOLOGY REPORT*  Clinical Data:  Altered mental status  CT HEAD WITHOUT CONTRAST  Technique:  Contiguous axial images were obtained from the base of the skull through the vertex without contrast  Comparison:  None.  Findings:  The brain has a normal appearance without evidence for hemorrhage, acute infarction, hydrocephalus, or mass lesion.  There is no extra axial fluid collection.  The skull and paranasal sinuses are normal. Minor atherosclerotic changes of the intracranial vessels.  IMPRESSION:  No acute intracranial finding by noncontrast CT.   Original Report Authenticated By: Judie Petit. Miles Costain, M.D.     Admission HPI: [name] is a 52 y.o. man with a pmhx of Bipolar d/o, HTN, COPD who presents from a correctional facility  with a cc of AMS. Patient presents by EMS. Per EMS report the patient has been incarcerated since 10/1. He was evaluated by nurse practitioner today and found to be covered in urine and feces. He was  unable to take anything by mouth. The patient was last seen normal two days ago. The patient reports that he he has had progressive weakness of the LE over the last 3 days. History is limited 2/2 short yes no responses by the patients. Denies numbness tingling, and changes in vision. He notes that he has not been given his medications (seroquel and cogentin) while in jail.   Admission Physical Exam  Constitutional: He appears well-developed and well-nourished.  HENT:  Mouth/Throat: Oropharynx is clear and moist. No oropharyngeal exudate.  Eyes: EOM are normal. Pupils are equal, round, and reactive to light.  Cardiovascular: Normal rate, regular rhythm and normal heart sounds.  Pulmonary/Chest: Effort normal. No respiratory distress. He has no wheezes. He has rales.  Abdominal: Soft. Bowel sounds are normal.  Musculoskeletal: He exhibits no edema and no tenderness.  Neurological:  Mental status poor. Unable to respond to most questions. Able to answer yes and no to direct simple questions.  Diffusely rigid. Able to move all extremities but very slowly. Cogwheel rigidity present. Hyperreflexic in bilateral BR. Intact to sensation bil.  Skin: Skin is warm. He is not diaphoretic.  Psychiatric: Muted affect and mood.    Hospital Course by problem list:  # Neuroleptic Malignant Syndrome - [name] was most likely caused by an abrupt cessation of his benztropine [place]. Benztropine is an anticholinergic medication that also acts to increase the availability of dopamine, and thus acts to decrease some of the side effects of antidopaminergic medications. Although [name] has been on the haldol decanoate injection for years, [name] can occur at any time and the abrupt cessation of  benztropine likely precipitated this event. He had received his most recent haldol decanoate injection on Oct 24th. [name] presented on Oct 8th with altered mental status changes, rigidity, and a low grade fever. A thorough diagnostic workup did not reveal any other possible cause of his presentation. CSF and fungal cultures were negative supporting our theory that this is a noninfectious process. During his hospitalization, we consulted neurology and psychiatry. We restarted his benztropine and treated him with supportive therapy and IVF. He received Ativan prn for muscle rigidity. On HD#1 he was transferred to the stepdown unit for closer monitoring and q2hr vitals. During his hospitalization his vital signs remained stable and his blood work improved daily. He experienced some difficulty with voiding and required in & out cath x2 but this resolved once his mental status and muscle rigidity improved. He was transferred to the floor on HD#4. He was evaluated by PT and was recommended to have home health PT at [facility] where he resides to help him with mobility initially. By the time of discharge his symptoms had almost returned to baseline.   # Rhabdomyolysis - The likely etiology of his rhabdomyolysis is a combination of his [name] and prolonged immobilization. CK Total on presentation was 15.7k and had improved to 2.4k by the time of discharge. The degree of elevation of CK has been shown to correlate with disease severity and prognosis. CK levels also may never normalize after an episode of [name]. Patients prone to [name] are also more likely to have elevated baseline CK. This patient had a CK Total of 340 in 04/2007 which is consistent with this fact and adds further support to our diagnosis. Rhabdo may cause hypophosphatemia secondary to shortage of phosphate for ATP, however [name] has a normal phosphorus level. His TSH was elevated at  6.108, consistent with his hypothyroidism and rules out thyroid  hormone excess as a cause of his rhabdo.    # Acute Kidney Injury - This is likely secondary to rhabdomyolysis. With the administration of IVF, his kidney function continued to improve and his BUN and creatinine improved from 62 and 1.73 on presentation to his baseline of 11 and 0.90 respectively by the time of discharge.  # Elevated Transaminases - He presented with a transaminitis of AST/ALT 283/86 which improved to 100/82 by the time of discharge.   # HTN - His blood pressures remained stable and he was continued on his home medications, metoprolol tartrate, amlodipine, and benazepril.   # Bipolar I - We held his Depakote and Seroquel while he was hospitalized.   # Alcohol Abuse - His ethanol level was 0 on admission. He did not receive CIWA since he was outside of the withdrawal window [place] [facility] [place]  Discharge Vitals:   BP 122/75  Pulse 67  Temp(Src) 99.1 F (37.3 C) (Oral)  Resp 16  Ht 5\' 6"  (1.676 m)  Wt 163 lb 12.8 oz (74.3 kg)  BMI 26.45 kg/m2  SpO2 95%  Discharge Labs:  Results for orders placed during the hospital encounter of 08/01/13 (from the past 24 hour(s))  COMPREHENSIVE METABOLIC PANEL     Status: Abnormal   Collection Time    08/06/13  4:40 AM      Result Value Range   Sodium 137  135 - 145 mEq/L   Potassium 3.9  3.5 - 5.1 mEq/L   Chloride 100  96 - 112 mEq/L   CO2 27  19 - 32 mEq/L   Glucose, Bld 86  70 - 99 mg/dL   BUN 11  6 - 23 mg/dL   Creatinine, Ser 1.61  0.50 - 1.35 mg/dL   Calcium 9.6  8.4 - 09.6 mg/dL   Total Protein 6.9  6.0 - 8.3 g/dL   Albumin 3.2 (*) 3.5 - 5.2 g/dL   AST 045 (*) 0 - 37 U/L   ALT 82 (*) 0 - 53 U/L   Alkaline Phosphatase 63  39 - 117 U/L   Total Bilirubin 0.3  0.3 - 1.2 mg/dL   GFR calc non Af Amer >90  >90 mL/min   GFR calc Af Amer >90  >90 mL/min  CBC     Status: Abnormal   Collection Time    08/06/13  4:40 AM      Result Value Range   WBC 4.2  4.0 - 10.5 K/uL   RBC 3.76 (*) 4.22 - 5.81 MIL/uL    Hemoglobin 11.1 (*) 13.0 - 17.0 g/dL   HCT 40.9 (*) 81.1 - 91.4 %   MCV 88.3  78.0 - 100.0 fL   MCH 29.5  26.0 - 34.0 pg   MCHC 33.4  30.0 - 36.0 g/dL   RDW 78.2  95.6 - 21.3 %   Platelets 212  150 - 400 K/uL  CK     Status: Abnormal   Collection Time    08/06/13  4:40 AM      Result Value Range   Total CK 2358 (*) 7 - 232 U/L  GLUCOSE, CAPILLARY     Status: None   Collection Time    08/06/13  6:17 AM      Result Value Range   Glucose-Capillary 77  70 - 99 mg/dL    Signed: Manuela Schwartz, MD 08/06/2013, 1:08 PM   Time Spent on  Discharge: >35 minutes Services Ordered on Discharge: PT Equipment Ordered on Discharge: wheeled walker

## 2013-08-29 LAB — FUNGUS CULTURE W SMEAR

## 2013-09-02 ENCOUNTER — Emergency Department (HOSPITAL_COMMUNITY)
Admission: EM | Admit: 2013-09-02 | Discharge: 2013-09-02 | Disposition: A | Discharge status: Skilled Nursing Facility | Payer: Medicare Other | Attending: Emergency Medicine | Admitting: Emergency Medicine

## 2013-09-02 DIAGNOSIS — J029 Acute pharyngitis, unspecified: Secondary | ICD-10-CM | POA: Insufficient documentation

## 2013-09-02 DIAGNOSIS — J449 Chronic obstructive pulmonary disease, unspecified: Secondary | ICD-10-CM | POA: Insufficient documentation

## 2013-09-02 DIAGNOSIS — K219 Gastro-esophageal reflux disease without esophagitis: Secondary | ICD-10-CM | POA: Insufficient documentation

## 2013-09-02 DIAGNOSIS — F319 Bipolar disorder, unspecified: Secondary | ICD-10-CM | POA: Insufficient documentation

## 2013-09-02 DIAGNOSIS — Z79899 Other long term (current) drug therapy: Secondary | ICD-10-CM | POA: Insufficient documentation

## 2013-09-02 DIAGNOSIS — Z7982 Long term (current) use of aspirin: Secondary | ICD-10-CM | POA: Insufficient documentation

## 2013-09-02 DIAGNOSIS — J4489 Other specified chronic obstructive pulmonary disease: Secondary | ICD-10-CM | POA: Insufficient documentation

## 2013-09-02 DIAGNOSIS — I10 Essential (primary) hypertension: Secondary | ICD-10-CM | POA: Insufficient documentation

## 2013-09-02 DIAGNOSIS — R112 Nausea with vomiting, unspecified: Secondary | ICD-10-CM | POA: Insufficient documentation

## 2013-09-02 DIAGNOSIS — F17200 Nicotine dependence, unspecified, uncomplicated: Secondary | ICD-10-CM | POA: Insufficient documentation

## 2013-09-02 DIAGNOSIS — R1110 Vomiting, unspecified: Secondary | ICD-10-CM

## 2013-09-02 DIAGNOSIS — E079 Disorder of thyroid, unspecified: Secondary | ICD-10-CM | POA: Insufficient documentation

## 2013-09-02 DIAGNOSIS — G4733 Obstructive sleep apnea (adult) (pediatric): Secondary | ICD-10-CM | POA: Insufficient documentation

## 2013-09-02 DIAGNOSIS — Z791 Long term (current) use of non-steroidal anti-inflammatories (NSAID): Secondary | ICD-10-CM | POA: Insufficient documentation

## 2013-09-02 MED ORDER — ONDANSETRON 4 MG PO TBDP: 4.0000 mg | ORAL_TABLET | Freq: Three times a day (TID) | ORAL | Status: DC | PRN

## 2013-09-02 MED ORDER — ONDANSETRON 4 MG PO TBDP
4.0000 mg | ORAL_TABLET | Freq: Once | ORAL | Status: AC
  Administered 2013-09-02: 4 mg via ORAL
  Filled 2013-09-02: qty 1

## 2013-09-02 NOTE — ED Notes (Signed)
Pt arrived via EMS from [facility] with a complaint of a sore throat that has been aggravated by the fact that he is vomiting once a day for the last week.  Pt has a hx. Of GERD and has talked to doctors about a medication regiment change.  Pt states that the need for vomiting wakes him up at night but he still has time to make it to the bathroom.

## 2013-09-02 NOTE — ED Provider Notes (Signed)
CSN: [id number]     Arrival date & time 09/02/13  0042 History   First MD Initiated Contact with Patient 09/02/13 0059     Chief Complaint  Patient presents with  . Sore Throat  . Nausea   (Consider location/radiation/quality/duration/timing/severity/associated sxs/prior Treatment) HPI Comments: Patient reports, that for the past, week, and half, he has occasionally vomited, usually at night.  He is asked his physician for Zofran, and he was told that this would happen patient lives at [facility], and no prescription has been provided to this facility, as of yet tonight.  He vomited after going to bed, so he decided to come to the emergency room to have it checked out.  Has no other complaints  Patient is a 52 y.o. male presenting with pharyngitis. The history is provided by the patient.  Sore Throat This is a recurrent problem. The current episode started 1 to 4 weeks ago. The problem occurs intermittently. The problem has been unchanged. Associated symptoms include nausea and vomiting. Pertinent negatives include no abdominal pain, anorexia, change in bowel habit, coughing, fever or weakness. Nothing aggravates the symptoms. He has tried nothing for the symptoms. The treatment provided no relief.    Past Medical History  Diagnosis Date  . Bipolar 1 disorder   . Hypertension   . Thyroid disease   . GERD (gastroesophageal reflux disease)   . Obstructive sleep apnea   . COPD (chronic obstructive pulmonary disease)    No past surgical history on file. Family History  Problem Relation Age of Onset  . Cirrhosis Mother   . Hypertension Mother   . Diabetes Mellitus II Mother    History  Substance Use Topics  . Smoking status: Current Every Day Smoker -- 0.50 packs/day    Types: Cigarettes  . Smokeless tobacco: Not on file  . Alcohol Use: Yes     Comment: about 2 beers every day.     Review of Systems  Constitutional: Negative for fever.  Respiratory: Negative for cough and  shortness of breath.   Gastrointestinal: Positive for nausea and vomiting. Negative for abdominal pain, anorexia and change in bowel habit.  Neurological: Negative for dizziness and weakness.  All other systems reviewed and are negative.    Allergies  Review of patient's allergies indicates no known allergies.  Home Medications   Current Outpatient Rx  Name  Route  Sig  Dispense  Refill  . amLODipine (NORVASC) 10 MG tablet   Oral   Take 10 mg by mouth every morning.          Marland Kitchen aspirin 325 MG tablet   Oral   Take 325 mg by mouth every evening. Take 30 min prior to Niaspan.         . benazepril (LOTENSIN) 20 MG tablet   Oral   Take 20 mg by mouth every morning.          . benztropine (COGENTIN) 1 MG tablet   Oral   Take 1 mg by mouth 2 (two) times daily.          . cholecalciferol (VITAMIN D) 1000 UNITS tablet   Oral   Take 1,000 Units by mouth every morning.          . divalproex (DEPAKOTE ER) 500 MG 24 hr tablet   Oral   Take 500 mg by mouth 2 (two) times daily.         Marland Kitchen docusate sodium (COLACE) 100 MG capsule   Oral  Take 200 mg by mouth 2 (two) times daily.         . ferrous sulfate 325 (65 FE) MG tablet   Oral   Take 325 mg by mouth 2 (two) times daily.         . hydrochlorothiazide (HYDRODIURIL) 25 MG tablet   Oral   Take 25 mg by mouth every morning.          Marland Kitchen levothyroxine (SYNTHROID, LEVOTHROID) 75 MCG tablet   Oral   Take 75 mcg by mouth daily before breakfast.          . LORazepam (ATIVAN) 2 MG tablet   Oral   Take 1 mg by mouth See admin instructions. Take 1mg  twice daily and 1mg  every 4 hours as needed for aggitation         . meloxicam (MOBIC) 7.5 MG tablet   Oral   Take 7.5 mg by mouth every morning.          . metoprolol tartrate (LOPRESSOR) 25 MG tablet   Oral   Take 25 mg by mouth 2 (two) times daily.         . multivitamin-iron-minerals-folic acid (THERAPEUTIC-M) TABS tablet   Oral   Take 1 tablet by  mouth every morning.          . niacin (NIASPAN) 750 MG CR tablet   Oral   Take 750 mg by mouth every evening.          Marland Kitchen omeprazole (PRILOSEC) 20 MG capsule   Oral   Take 20 mg by mouth every morning.          . polyethylene glycol (MIRALAX / GLYCOLAX) packet   Oral   Take 17 g by mouth every morning.          . potassium chloride (K-DUR) 10 MEQ tablet   Oral   Take 10 mEq by mouth every morning.          Marland Kitchen QUEtiapine (SEROQUEL) 200 MG tablet   Oral   Take 200 mg by mouth 2 (two) times daily.         . sertraline (ZOLOFT) 100 MG tablet   Oral   Take 150 mg by mouth every morning.          . simvastatin (ZOCOR) 40 MG tablet   Oral   Take 40 mg by mouth every evening.         . tobramycin-dexamethasone (TOBRADEX) ophthalmic solution   Both Eyes   Place 1 drop into both eyes 4 (four) times daily.           Marland Kitchen zolpidem (AMBIEN) 10 MG tablet   Oral   Take 10 mg by mouth at bedtime as needed for sleep.         . ABILIFY MAINTENA 400 MG SUSR   Intramuscular   Inject 400 mg into the muscle every 30 (thirty) days.         . ARIPiprazole (ABILIFY) 10 MG tablet   Oral   Take 10 mg by mouth every morning.          BP 126/93  Pulse 81  Temp(Src) 98.4 F (36.9 C) (Oral)  Resp 18  Ht 5\' 6"  (1.676 m)  Wt 180 lb (81.647 kg)  BMI 29.07 kg/m2  SpO2 100% Physical Exam  Nursing note and vitals reviewed. Constitutional: He is oriented to person, place, and time. He appears well-developed and well-nourished.  HENT:  Head: Normocephalic.  Eyes: Pupils  are equal, round, and reactive to light.  Neck: Normal range of motion.  Cardiovascular: Normal rate and regular rhythm.   Pulmonary/Chest: Effort normal and breath sounds normal.  Abdominal: Soft. Bowel sounds are normal. He exhibits no distension. There is no tenderness. There is no rebound and no guarding.  Musculoskeletal: Normal range of motion.  Lymphadenopathy:    He has no cervical adenopathy.   Neurological: He is alert and oriented to person, place, and time.  Skin: Skin is warm. No rash noted. No erythema.    ED Course  Procedures (including critical care time) Labs Review Labs Reviewed - No data to display Imaging Review No results found.  EKG Interpretation   None       MDM  No diagnosis found.  Patient is in no distress.  His exam is totally benign.  His vital signs are stable.  At this time.  I do not feel the necessity to perform labs were still further into occasional nausea and vomiting.  In this patient, who has a history of GERD, and has a primary care physician    Arman Filter, NP 09/02/13 [phone]

## 2013-09-02 NOTE — ED Provider Notes (Signed)
Medical screening examination/treatment/procedure(s) were performed by non-physician practitioner and as supervising physician I was immediately available for consultation/collaboration.  EKG Interpretation   None        Martika Egler K Dominik Lauricella-Rasch, MD 09/02/13 0236 

## 2014-08-02 ENCOUNTER — Observation Stay (HOSPITAL_COMMUNITY): Payer: Medicare Other

## 2014-08-02 ENCOUNTER — Inpatient Hospital Stay (HOSPITAL_COMMUNITY)
Admission: EM | Admit: 2014-08-02 | Discharge: 2014-08-05 | DRG: 641 | Disposition: A | Discharge status: Nursing Home (Includes ICF) | Payer: Medicare Other | Attending: Internal Medicine | Admitting: Internal Medicine

## 2014-08-02 ENCOUNTER — Encounter (HOSPITAL_COMMUNITY): Payer: Self-pay | Admitting: Emergency Medicine

## 2014-08-02 DIAGNOSIS — F209 Schizophrenia, unspecified: Secondary | ICD-10-CM | POA: Diagnosis present

## 2014-08-02 DIAGNOSIS — R4182 Altered mental status, unspecified: Secondary | ICD-10-CM | POA: Diagnosis not present

## 2014-08-02 DIAGNOSIS — F17210 Nicotine dependence, cigarettes, uncomplicated: Secondary | ICD-10-CM | POA: Diagnosis present

## 2014-08-02 DIAGNOSIS — G4733 Obstructive sleep apnea (adult) (pediatric): Secondary | ICD-10-CM

## 2014-08-02 DIAGNOSIS — F319 Bipolar disorder, unspecified: Secondary | ICD-10-CM

## 2014-08-02 DIAGNOSIS — E039 Hypothyroidism, unspecified: Secondary | ICD-10-CM | POA: Diagnosis present

## 2014-08-02 DIAGNOSIS — M6282 Rhabdomyolysis: Secondary | ICD-10-CM

## 2014-08-02 DIAGNOSIS — K219 Gastro-esophageal reflux disease without esophagitis: Secondary | ICD-10-CM | POA: Diagnosis present

## 2014-08-02 DIAGNOSIS — Z7982 Long term (current) use of aspirin: Secondary | ICD-10-CM

## 2014-08-02 DIAGNOSIS — Z79899 Other long term (current) drug therapy: Secondary | ICD-10-CM | POA: Diagnosis not present

## 2014-08-02 DIAGNOSIS — E785 Hyperlipidemia, unspecified: Secondary | ICD-10-CM

## 2014-08-02 DIAGNOSIS — F259 Schizoaffective disorder, unspecified: Secondary | ICD-10-CM | POA: Diagnosis present

## 2014-08-02 DIAGNOSIS — E162 Hypoglycemia, unspecified: Principal | ICD-10-CM

## 2014-08-02 DIAGNOSIS — F29 Unspecified psychosis not due to a substance or known physiological condition: Secondary | ICD-10-CM

## 2014-08-02 DIAGNOSIS — Z9119 Patient's noncompliance with other medical treatment and regimen: Secondary | ICD-10-CM | POA: Diagnosis present

## 2014-08-02 DIAGNOSIS — I10 Essential (primary) hypertension: Secondary | ICD-10-CM | POA: Diagnosis present

## 2014-08-02 DIAGNOSIS — J449 Chronic obstructive pulmonary disease, unspecified: Secondary | ICD-10-CM | POA: Diagnosis present

## 2014-08-02 DIAGNOSIS — R0902 Hypoxemia: Secondary | ICD-10-CM

## 2014-08-02 DIAGNOSIS — R740 Nonspecific elevation of levels of transaminase and lactic acid dehydrogenase [LDH]: Secondary | ICD-10-CM

## 2014-08-02 DIAGNOSIS — R0602 Shortness of breath: Secondary | ICD-10-CM

## 2014-08-02 DIAGNOSIS — R7401 Elevation of levels of liver transaminase levels: Secondary | ICD-10-CM

## 2014-08-02 LAB — CBG MONITORING, ED
GLUCOSE-CAPILLARY: 74 mg/dL (ref 70–99)
GLUCOSE-CAPILLARY: 53 mg/dL — AB (ref 70–99)
GLUCOSE-CAPILLARY: 86 mg/dL (ref 70–99)
GLUCOSE-CAPILLARY: 72 mg/dL (ref 70–99)
Glucose-Capillary: 49 mg/dL — ABNORMAL LOW (ref 70–99)
Glucose-Capillary: 67 mg/dL — ABNORMAL LOW (ref 70–99)
Glucose-Capillary: 102 mg/dL — ABNORMAL HIGH (ref 70–99)
Glucose-Capillary: 85 mg/dL (ref 70–99)
Glucose-Capillary: 61 mg/dL — ABNORMAL LOW (ref 70–99)

## 2014-08-02 LAB — COMPREHENSIVE METABOLIC PANEL
ALT: 29 U/L (ref 0–53)
AST: 62 U/L — AB (ref 0–37)
Albumin: 4 g/dL (ref 3.5–5.2)
Alkaline Phosphatase: 74 U/L (ref 39–117)
Anion gap: 14 (ref 5–15)
BUN: 21 mg/dL (ref 6–23)
CALCIUM: 9.1 mg/dL (ref 8.4–10.5)
CO2: 26 meq/L (ref 19–32)
CREATININE: 1.04 mg/dL (ref 0.50–1.35)
Chloride: 100 mEq/L (ref 96–112)
GFR calc Af Amer: >90 mL/min (ref >90)
GFR, EST NON AFRICAN AMERICAN: 80 mL/min — AB (ref >90)
Glucose, Bld: 53 mg/dL — ABNORMAL LOW (ref 70–99)
Potassium: 3.8 mEq/L (ref 3.7–5.3)
Sodium: 140 mEq/L (ref 137–147)
Total Bilirubin: 0.2 mg/dL — ABNORMAL LOW (ref 0.3–1.2)
Total Protein: 7.6 g/dL (ref 6.0–8.3)

## 2014-08-02 LAB — CBC
HEMATOCRIT: 35.8 % — AB (ref 39.0–52.0)
Hemoglobin: 11.4 g/dL — ABNORMAL LOW (ref 13.0–17.0)
MCH: 28.4 pg (ref 26.0–34.0)
MCHC: 31.8 g/dL (ref 30.0–36.0)
MCV: 89.1 fL (ref 78.0–100.0)
PLATELETS: 227 10*3/uL (ref 150–400)
RBC: 4.02 MIL/uL — ABNORMAL LOW (ref 4.22–5.81)
RDW: 15.3 % (ref 11.5–15.5)
WBC: 6.6 10*3/uL (ref 4.0–10.5)

## 2014-08-02 LAB — RAPID URINE DRUG SCREEN, HOSP PERFORMED
Amphetamines: NOT DETECTED
BARBITURATES: NOT DETECTED
BENZODIAZEPINES: NOT DETECTED
Cocaine: NOT DETECTED
Opiates: NOT DETECTED
Tetrahydrocannabinol: NOT DETECTED

## 2014-08-02 LAB — SALICYLATE LEVEL

## 2014-08-02 LAB — GLUCOSE, CAPILLARY
Glucose-Capillary: 118 mg/dL — ABNORMAL HIGH (ref 70–99)
Glucose-Capillary: 56 mg/dL — ABNORMAL LOW (ref 70–99)
Glucose-Capillary: 70 mg/dL (ref 70–99)
Glucose-Capillary: 73 mg/dL (ref 70–99)

## 2014-08-02 LAB — CK: CK TOTAL: 1531 U/L — AB (ref 7–232)

## 2014-08-02 LAB — VALPROIC ACID LEVEL: VALPROIC ACID LVL: 22.8 ug/mL — AB (ref 50.0–100.0)

## 2014-08-02 LAB — ACETAMINOPHEN LEVEL: Acetaminophen (Tylenol), Serum: <15 ug/mL (ref 10–30)

## 2014-08-02 LAB — ETHANOL

## 2014-08-02 MED ORDER — DEXTROSE-NACL 5-0.9 % IV SOLN
INTRAVENOUS | Status: DC
  Administered 2014-08-02: 23:00:00 via INTRAVENOUS
  Administered 2014-08-03: 1000 mL via INTRAVENOUS
  Administered 2014-08-03 – 2014-08-04 (×3): via INTRAVENOUS

## 2014-08-02 MED ORDER — POLYETHYLENE GLYCOL 3350 17 G PO PACK
17.0000 g | PACK | Freq: Every morning | ORAL | Status: DC
  Administered 2014-08-03 – 2014-08-05 (×3): 17 g via ORAL
  Filled 2014-08-02 (×3): qty 1

## 2014-08-02 MED ORDER — ONDANSETRON 4 MG PO TBDP: 4.0000 mg | ORAL_TABLET | Freq: Once | ORAL | Status: AC

## 2014-08-02 MED ORDER — ALUM & MAG HYDROXIDE-SIMETH 200-200-20 MG/5ML PO SUSP: 30.0000 mL | Freq: Four times a day (QID) | ORAL | Status: DC | PRN

## 2014-08-02 MED ORDER — GLUCOSE 40 % PO GEL: 1.0000 | ORAL | Status: DC | PRN

## 2014-08-02 MED ORDER — ADULT MULTIVITAMIN W/MINERALS CH
1.0000 | ORAL_TABLET | Freq: Every day | ORAL | Status: DC
  Administered 2014-08-03 – 2014-08-05 (×3): 1 via ORAL
  Filled 2014-08-02 (×3): qty 1

## 2014-08-02 MED ORDER — DEXTROSE-NACL 5-0.9 % IV SOLN
INTRAVENOUS | Status: DC
  Administered 2014-08-02: 17:00:00 via INTRAVENOUS

## 2014-08-02 MED ORDER — ACETAMINOPHEN 325 MG PO TABS: 650.0000 mg | ORAL_TABLET | ORAL | Status: DC | PRN

## 2014-08-02 MED ORDER — ZOLPIDEM TARTRATE 5 MG PO TABS: 10.0000 mg | ORAL_TABLET | Freq: Every evening | ORAL | Status: DC | PRN

## 2014-08-02 MED ORDER — ASPIRIN 325 MG PO TABS
325.0000 mg | ORAL_TABLET | Freq: Every evening | ORAL | Status: DC
  Administered 2014-08-02 – 2014-08-04 (×3): 325 mg via ORAL
  Filled 2014-08-02 (×4): qty 1

## 2014-08-02 MED ORDER — QUETIAPINE FUMARATE 200 MG PO TABS
200.0000 mg | ORAL_TABLET | Freq: Two times a day (BID) | ORAL | Status: DC
  Administered 2014-08-02 – 2014-08-05 (×6): 200 mg via ORAL
  Filled 2014-08-02 (×7): qty 1

## 2014-08-02 MED ORDER — HYDROCHLOROTHIAZIDE 25 MG PO TABS
25.0000 mg | ORAL_TABLET | Freq: Every morning | ORAL | Status: DC
  Administered 2014-08-03 – 2014-08-05 (×3): 25 mg via ORAL
  Filled 2014-08-02 (×3): qty 1

## 2014-08-02 MED ORDER — ZIPRASIDONE MESYLATE 20 MG IM SOLR: 10.0000 mg | Freq: Once | INTRAMUSCULAR | Status: DC

## 2014-08-02 MED ORDER — DIVALPROEX SODIUM ER 500 MG PO TB24
500.0000 mg | ORAL_TABLET | Freq: Two times a day (BID) | ORAL | Status: DC
  Administered 2014-08-02 – 2014-08-05 (×6): 500 mg via ORAL
  Filled 2014-08-02 (×7): qty 1

## 2014-08-02 MED ORDER — MORPHINE SULFATE 2 MG/ML IJ SOLN: 1.0000 mg | INTRAMUSCULAR | Status: DC | PRN

## 2014-08-02 MED ORDER — POTASSIUM CHLORIDE ER 10 MEQ PO TBCR
10.0000 meq | EXTENDED_RELEASE_TABLET | Freq: Every morning | ORAL | Status: DC
  Administered 2014-08-03 – 2014-08-05 (×3): 10 meq via ORAL
  Filled 2014-08-02 (×3): qty 1

## 2014-08-02 MED ORDER — ONDANSETRON HCL 4 MG PO TABS: 4.0000 mg | ORAL_TABLET | Freq: Four times a day (QID) | ORAL | Status: DC | PRN

## 2014-08-02 MED ORDER — AMLODIPINE BESYLATE 10 MG PO TABS
10.0000 mg | ORAL_TABLET | Freq: Every morning | ORAL | Status: DC
  Administered 2014-08-03 – 2014-08-05 (×3): 10 mg via ORAL
  Filled 2014-08-02 (×3): qty 1

## 2014-08-02 MED ORDER — METOPROLOL TARTRATE 50 MG PO TABS
50.0000 mg | ORAL_TABLET | Freq: Two times a day (BID) | ORAL | Status: DC
Start: 2014-08-02 — End: 2014-08-05
  Administered 2014-08-02 – 2014-08-05 (×6): 50 mg via ORAL
  Filled 2014-08-02 (×7): qty 1

## 2014-08-02 MED ORDER — TOBRAMYCIN-DEXAMETHASONE 0.3-0.1 % OP SUSP
1.0000 [drp] | Freq: Four times a day (QID) | OPHTHALMIC | Status: DC
  Administered 2014-08-02 – 2014-08-05 (×10): 1 [drp] via OPHTHALMIC
  Filled 2014-08-02: qty 2.5

## 2014-08-02 MED ORDER — GUAIFENESIN-DM 100-10 MG/5ML PO SYRP
5.0000 mL | ORAL_SOLUTION | ORAL | Status: DC | PRN
  Filled 2014-08-02: qty 5

## 2014-08-02 MED ORDER — LORAZEPAM 2 MG/ML IJ SOLN: 2.0000 mg | Freq: Four times a day (QID) | INTRAMUSCULAR | Status: DC | PRN

## 2014-08-02 MED ORDER — HEPARIN SODIUM (PORCINE) 5000 UNIT/ML IJ SOLN
5000.0000 [IU] | Freq: Three times a day (TID) | INTRAMUSCULAR | Status: DC
  Administered 2014-08-03 – 2014-08-05 (×7): 5000 [IU] via SUBCUTANEOUS
  Filled 2014-08-02 (×10): qty 1

## 2014-08-02 MED ORDER — NICOTINE 21 MG/24HR TD PT24
21.0000 mg | MEDICATED_PATCH | Freq: Every day | TRANSDERMAL | Status: DC
  Administered 2014-08-02: 21 mg via TRANSDERMAL
  Filled 2014-08-02: qty 1

## 2014-08-02 MED ORDER — ALBUTEROL SULFATE (2.5 MG/3ML) 0.083% IN NEBU: 2.5000 mg | INHALATION_SOLUTION | RESPIRATORY_TRACT | Status: DC | PRN

## 2014-08-02 MED ORDER — ACETAMINOPHEN 325 MG PO TABS: 650.0000 mg | ORAL_TABLET | Freq: Four times a day (QID) | ORAL | Status: DC | PRN

## 2014-08-02 MED ORDER — OXYCODONE HCL 5 MG PO TABS: 5.0000 mg | ORAL_TABLET | ORAL | Status: DC | PRN

## 2014-08-02 MED ORDER — SERTRALINE HCL 100 MG PO TABS
150.0000 mg | ORAL_TABLET | Freq: Every morning | ORAL | Status: DC
  Administered 2014-08-03 – 2014-08-05 (×3): 150 mg via ORAL
  Filled 2014-08-02 (×3): qty 1

## 2014-08-02 MED ORDER — LORAZEPAM 1 MG PO TABS: 1.0000 mg | ORAL_TABLET | Freq: Three times a day (TID) | ORAL | Status: DC

## 2014-08-02 MED ORDER — GLUCOSE 40 % PO GEL
ORAL | Status: AC
  Filled 2014-08-02: qty 1

## 2014-08-02 MED ORDER — PANTOPRAZOLE SODIUM 40 MG PO TBEC
40.0000 mg | DELAYED_RELEASE_TABLET | Freq: Every day | ORAL | Status: DC
  Administered 2014-08-03 – 2014-08-05 (×3): 40 mg via ORAL
  Filled 2014-08-02 (×3): qty 1

## 2014-08-02 MED ORDER — ACETAMINOPHEN 650 MG RE SUPP: 650.0000 mg | Freq: Four times a day (QID) | RECTAL | Status: DC | PRN

## 2014-08-02 MED ORDER — BENAZEPRIL HCL 20 MG PO TABS
20.0000 mg | ORAL_TABLET | Freq: Every morning | ORAL | Status: DC
  Administered 2014-08-03 – 2014-08-05 (×3): 20 mg via ORAL
  Filled 2014-08-02 (×3): qty 1

## 2014-08-02 MED ORDER — LORAZEPAM 1 MG PO TABS
1.0000 mg | ORAL_TABLET | Freq: Three times a day (TID) | ORAL | Status: DC
  Administered 2014-08-02 – 2014-08-05 (×8): 1 mg via ORAL
  Filled 2014-08-02 (×8): qty 1

## 2014-08-02 MED ORDER — LEVOTHYROXINE SODIUM 75 MCG PO TABS
75.0000 ug | ORAL_TABLET | Freq: Every day | ORAL | Status: DC
  Administered 2014-08-03 – 2014-08-05 (×3): 75 ug via ORAL
  Filled 2014-08-02 (×4): qty 1

## 2014-08-02 MED ORDER — IBUPROFEN 600 MG PO TABS
600.0000 mg | ORAL_TABLET | Freq: Three times a day (TID) | ORAL | Status: DC | PRN
  Filled 2014-08-02: qty 1

## 2014-08-02 MED ORDER — THERAGRAN-M PO TABS: 1.0000 | ORAL_TABLET | Freq: Every morning | ORAL | Status: DC

## 2014-08-02 MED ORDER — BENZTROPINE MESYLATE 1 MG PO TABS
1.0000 mg | ORAL_TABLET | Freq: Two times a day (BID) | ORAL | Status: DC
  Administered 2014-08-02 – 2014-08-05 (×6): 1 mg via ORAL
  Filled 2014-08-02 (×7): qty 1

## 2014-08-02 MED ORDER — ONDANSETRON HCL 4 MG/2ML IJ SOLN
4.0000 mg | Freq: Four times a day (QID) | INTRAMUSCULAR | Status: DC | PRN
Start: 2014-08-02 — End: 2014-08-05

## 2014-08-02 MED ORDER — ONDANSETRON HCL 4 MG PO TABS
4.0000 mg | ORAL_TABLET | Freq: Three times a day (TID) | ORAL | Status: DC | PRN
  Administered 2014-08-02: 4 mg via ORAL
  Filled 2014-08-02: qty 1

## 2014-08-02 MED ORDER — ALUM & MAG HYDROXIDE-SIMETH 200-200-20 MG/5ML PO SUSP: 30.0000 mL | ORAL | Status: DC | PRN

## 2014-08-02 NOTE — ED Notes (Signed)
Attempted to get pt temperature, pt bit the thermometer probe

## 2014-08-02 NOTE — Progress Notes (Signed)
Patient declines the use of nocturnal CPAP. Sitter at bedside, patient appears joyful, engaged in communication and appropriate. He denies the use of CPAP when not in the hospital.

## 2014-08-02 NOTE — H&P (Signed)
PATIENT DETAILS Name: [name] Age: 53 y.o. Sex: male Date of Birth: [birth year] Admit Date: 08/02/2014 PCP:Pcp Not In System   CHIEF COMPLAINT:  [place] by police- with altered mental status[place]  HPI: [name] is a 53 y.o. male with a Past Medical History of bipolar disorder, COPD, obstructive sleep apnea, hypertension, dyslipidemia who presents today with the above noted complaint. Please note, patient is a very poor historian-as he answers only some questions appropriately, is not able to even tell me where he has been living at. Most of this history is obtained after talking with the ED staff, patient's sister over the phone, and collaborating some history from the social work and case management in the emergency room. Patient [place] wandering around [place] around [place]00 [facility], was brought to the emergency room for further evaluation. He was not answering any questions appropriately, he was not able to tell any of the ED staff where he was living and with whom he was living. He was observed in the emergency room, and was noted to be persistently hypoglycemic. I was subsequently asked to admit this patient for further evaluation and treatment. After further discussion with ED staff-mainly ED case management, we located a number for the patient's sister Lauris ChromanCora Lamons whom I spoke over the phone. Apparently patient is living in a assisted living facility here locally, and apparently per sister- patient is allowed to leave the facility. She thinks that the patient may have left the facility and not have returned. She last saw her brother on Wednesday at the assisted living facility. Patient's sister-Ms. Phifer-does confirm that if patient is not taking his medications, he does behave very inappropriately. His assisted living facility was subsequently contacted by case management, and a list of patient's current medication was faxed over to the  emergency room.  While in the emergency room for the past few hours, apart from hypoglycemia and an appropriate behavior status confusion-the patient's ED course has been unremarkable. He has not been febrile. He has not noted to have any nausea vomiting or diarrhea. He denies abdominal pain, chest pain, shortness of breath upon repeated questioning.  ALLERGIES:  No Known Allergies  PAST MEDICAL HISTORY: Past Medical History  Diagnosis Date  . Bipolar 1 disorder   . Hypertension   . Thyroid disease   . GERD (gastroesophageal reflux disease)   . Obstructive sleep apnea   . COPD (chronic obstructive pulmonary disease)     PAST SURGICAL HISTORY: History reviewed. No pertinent past surgical history.  MEDICATIONS AT HOME: Prior to Admission medications   Medication Sig Start Date End Date Taking? Authorizing Provider  ABILIFY MAINTENA 400 MG SUSR Inject 400 mg into the muscle every 30 (thirty) days. 08/29/13   Historical Provider, MD  amLODipine (NORVASC) 10 MG tablet Take 10 mg by mouth every morning.     Historical Provider, MD  ARIPiprazole (ABILIFY) 10 MG tablet Take 10 mg by mouth every morning.    Historical Provider, MD  aspirin 325 MG tablet Take 325 mg by mouth every evening. Take 30 min prior to Niaspan.    Historical Provider, MD  benazepril (LOTENSIN) 20 MG tablet Take 20 mg by mouth every morning.     Historical Provider, MD  benztropine (COGENTIN) 1 MG tablet Take 1 mg by mouth 2 (two) times daily.     Historical Provider, MD  cholecalciferol (VITAMIN D) 1000 UNITS tablet  Take 1,000 Units by mouth every morning.     Historical Provider, MD  divalproex (DEPAKOTE ER) 500 MG 24 hr tablet Take 500 mg by mouth 2 (two) times daily.    Historical Provider, MD  docusate sodium (COLACE) 100 MG capsule Take 200 mg by mouth 2 (two) times daily.    Historical Provider, MD  ferrous sulfate 325 (65 FE) MG tablet Take 325 mg by mouth 2 (two) times daily.    Historical Provider, MD    hydrochlorothiazide (HYDRODIURIL) 25 MG tablet Take 25 mg by mouth every morning.     Historical Provider, MD  levothyroxine (SYNTHROID, LEVOTHROID) 75 MCG tablet Take 75 mcg by mouth daily before breakfast.     Historical Provider, MD  LORazepam (ATIVAN) 2 MG tablet Take 1 mg by mouth See admin instructions. Take 1mg  twice daily and 1mg  every 4 hours as needed for aggitation    Historical Provider, MD  meloxicam (MOBIC) 7.5 MG tablet Take 7.5 mg by mouth every morning.     Historical Provider, MD  metoprolol tartrate (LOPRESSOR) 25 MG tablet Take 25 mg by mouth 2 (two) times daily.    Historical Provider, MD  multivitamin-iron-minerals-folic acid (THERAPEUTIC-M) TABS tablet Take 1 tablet by mouth every morning.     Historical Provider, MD  niacin (NIASPAN) 750 MG CR tablet Take 750 mg by mouth every evening.     Historical Provider, MD  omeprazole (PRILOSEC) 20 MG capsule Take 20 mg by mouth every morning.     Historical Provider, MD  ondansetron (ZOFRAN-ODT) 4 MG disintegrating tablet Take 1 tablet (4 mg total) by mouth every 8 (eight) hours as needed for nausea or vomiting. 09/02/13   Arman Filter, NP  polyethylene glycol (MIRALAX / Ethelene Hal) packet Take 17 g by mouth every morning.     Historical Provider, MD  potassium chloride (K-DUR) 10 MEQ tablet Take 10 mEq by mouth every morning.     Historical Provider, MD  QUEtiapine (SEROQUEL) 200 MG tablet Take 200 mg by mouth 2 (two) times daily.    Historical Provider, MD  sertraline (ZOLOFT) 100 MG tablet Take 150 mg by mouth every morning.     Historical Provider, MD  simvastatin (ZOCOR) 40 MG tablet Take 40 mg by mouth every evening.    Historical Provider, MD  tobramycin-dexamethasone [facility]) ophthalmic solution Place 1 drop into both eyes 4 (four) times daily.      Historical Provider, MD  zolpidem (AMBIEN) 10 MG tablet Take 10 mg by mouth at bedtime as needed for sleep.    Historical Provider, MD    FAMILY HISTORY: Family History   Problem Relation Age of Onset  . Cirrhosis Mother   . Hypertension Mother   . Diabetes Mellitus II Mother     SOCIAL HISTORY:  reports that he has been smoking Cigarettes.  He has been smoking about 0.50 packs per day. He does not have any smokeless tobacco history on file. He reports that he drinks alcohol. He reports that he does not use illicit drugs.  REVIEW OF SYSTEMS:  Unable to obtain given mental status   PHYSICAL EXAM: Blood pressure 134/100, pulse 84, temperature 98.8 F (37.1 C), temperature source Oral, resp. rate 20, height 5\' 6"  (1.676 m), weight 67.677 kg (149 lb 3.2 oz), SpO2 98.00%.  General appearance :Awake,answers some questions appropriately, not in any distress. Speech Clear. Not toxic Looking HEENT: Atraumatic and Normocephalic, pupils equally reactive to light and accomodation Neck: supple, no JVD. No cervical  lymphadenopathy.  Chest:Good air entry bilaterally, no added sounds  CVS: S1 S2 regular, no murmurs.  Abdomen: Bowel sounds present, Non tender and not distended with no gaurding, rigidity or rebound. Extremities: B/L Lower Ext shows no edema, both legs are warm to touch Neurology: Awake alert, and oriented X 3, CN II-XII intact, Non focal Skin:No Rash Wounds:N/A  LABS ON ADMISSION:   Recent Labs  08/02/14 0634  NA 140  K 3.8  CL 100  CO2 26  GLUCOSE 53*  BUN 21  CREATININE 1.04  CALCIUM 9.1    Recent Labs  08/02/14 0634  AST 62*  ALT 29  ALKPHOS 74  BILITOT 0.2*  PROT 7.6  ALBUMIN 4.0   No results found for this basename: LIPASE, AMYLASE,  in the last 72 hours  Recent Labs  08/02/14 0634  WBC 6.6  HGB 11.4*  HCT 35.8*  MCV 89.1  PLT 227    Recent Labs  08/02/14 1629  CKTOTAL 1531*   No results found for this basename: DDIMER,  in the last 72 hours No components found with this basename: POCBNP,    RADIOLOGIC STUDIES ON ADMISSION: Ct Head Wo Contrast  08/02/2014   CLINICAL DATA:  Altered mental status,  disoriented; history of bipolar disorder and hypertension and thyroid disease  EXAM: CT HEAD WITHOUT CONTRAST  TECHNIQUE: Contiguous axial images were obtained from the base of the skull through the vertex without intravenous contrast.  COMPARISON:  Noncontrast CT scan of the brain of August 01, 2013  FINDINGS: The ventricles are normal in size and position. There is no intracranial hemorrhage nor intracranial mass effect. There is no acute ischemic change. The cerebellum and brainstem are unremarkable.  The observed paranasal sinuses and mastoid air cells are clear. There is no acute skull fracture.  IMPRESSION: There is no acute intracranial abnormality. There is no acute skull fracture.   Electronically Signed   By: David  [place]   On: 08/02/2014 17:17     EKG: Independently reviewed. NSR  ASSESSMENT AND PLAN: Present on Admission:  . Hypoglycemia -? Etiology-suspect secondary to poor oral intake  - Will be admitted to a medical surgical unit, hydrate with D5 normal saline, check CBGs frequently. Check A1c, per insulin and C-peptide levels. We will also check a sulfonylurea level. If CBGs continues to be low, will need to start D. 10 infusion.  Marland Kitchen HTN (hypertension) - Will continue prior antihypertensive medications-holding parameters ordered. Continue to follow BP  and adjust medications accordingly   . Bipolar 1 disorder/psychosis  - Unable to answer most questions appropriately - suspect secondary to underlying psychiatric issue. Urine drug screen, alcohol level, so decided to the level negative. CT head negative. Neck appears supple, no fever. Have consulted psychiatry. Bedside sitter in place, patient cannot sign out AGAINST MEDICAL ADVICE-he has been involuntarily committed by the ED physician   . Rhabdomyolysis - Mild elevation in CK levels-likely secondary to excessive muscle use-although on a lot of psychiatric medications-does not appear to be rigid at this time. Will hydrate, restart  benztropine, as needed Ativan and other antipsychotic medications.   . Obstructive sleep apnea - CPAP when necessary   . Dyslipidemia - Due to mild elevation in CK-hold statins for now  Further plan will depend as patient's clinical course evolves and further radiologic and laboratory data become available. Patient will be monitored closely.  Above noted plan was discussed with sister over the phone, she was  in agreement.   DVT Prophylaxis: Prophylactic  Lovenox  Code Status: Full Code  Total time spent for admission equals 45 minutes.  [facility] Inc[facility]IMIRE,Dayten Juba [facility] Pager [phone]  If 7PM-7AM, please contact night-coverage [email or web address] Password [email or web address] 08/02/2014, 6:21 PM  **Disclaimer: This note may have been dictated with voice recognition software. Similar sounding words can inadvertently be transcribed and this note may contain transcription errors which may not have been corrected upon publication of note.**

## 2014-08-02 NOTE — ED Notes (Signed)
Pt had 8oz of water

## 2014-08-02 NOTE — Progress Notes (Signed)
Pt was assessed and meets criteria for inpatient placement. [facility]  SW will continue to pursue placement.  Derrell Lollingoris Daesha [place], MSW Clinical Social Worker [phone]

## 2014-08-02 NOTE — ED Notes (Signed)
This RN witnessed the pt leave his room and start walking towards the ambulance bay doors.  Pt stated "everyone has black beards" and that he doesn't want to stay.  Pt returned to room without incident.  Pt is smiling, cooperative with coaxing, and telling staff he "loves" them.

## 2014-08-02 NOTE — ED Notes (Signed)
Pt drank 8oz of chocolate milk

## 2014-08-02 NOTE — ED Notes (Signed)
Per PTAR pt was running through a cemetary naked. NAD at this time. Pt alert to self, place and situation. Pt disoriented to time.

## 2014-08-02 NOTE — ED Notes (Signed)
Pt received lunch meal tray. Encouraging pt to eat. Sitter at bedside.

## 2014-08-02 NOTE — ED Notes (Addendum)
   Pt had 9 oz of orange juice

## 2014-08-02 NOTE — ED Notes (Signed)
Pt refuses to have temperature taken because "its a machine with wheels and it will hurt me"

## 2014-08-02 NOTE — ED Provider Notes (Signed)
CSN: [id number]     Arrival date & time 08/02/14  0549 History   First MD Initiated Contact with Patient 08/02/14 [phone]     Chief Complaint  Patient presents with  . Altered Mental Status   Level 5 for psychiatric disorder  (Consider location/radiation/quality/duration/timing/severity/associated sxs/prior Treatment) HPI Per [facility] ambulance service patient was [place] running [place] a [place] naked tonight. When I talk to patient he answers inappropriately. 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 He talks about not putting cats out in zero degree weather. He also states he is super man and not NauruBatman. Patient also talks about the light in the sky. He does not know what year it is. He does not tell me what facility he is in. He has only complaint is that he is cold. He denies taking his medication, he does admit to smoking cigarettes and drinking alcohol.    PCP Unknown Psych Unknown  Past Medical History  Diagnosis Date  . Bipolar 1 disorder   . Hypertension   . Thyroid disease   . GERD (gastroesophageal reflux disease)   . Obstructive sleep apnea   . COPD (chronic obstructive pulmonary disease)    History reviewed. No pertinent past surgical history. Family History  Problem Relation Age of Onset  . Cirrhosis Mother   . Hypertension Mother   . Diabetes Mellitus II Mother    History  Substance Use Topics  . Smoking status: Current Every Day Smoker -- 0.50 packs/day    Types: Cigarettes  . Smokeless tobacco: Not on file  . Alcohol Use: Yes     Comment: about 2 beers every day.     Review of Systems  Unable to perform ROS: Psychiatric disorder      Allergies  Review of patient's allergies indicates no known allergies.  Home Medications   Prior to Admission medications   Medication Sig Start Date End Date Taking? Authorizing Provider  ABILIFY MAINTENA 400 MG SUSR Inject 400 mg into the muscle every 30 (thirty) days. 08/29/13    Historical Provider, MD  amLODipine (NORVASC) 10 MG tablet Take 10 mg by mouth every morning.     Historical Provider, MD  ARIPiprazole (ABILIFY) 10 MG tablet Take 10 mg by mouth every morning.    Historical Provider, MD  aspirin 325 MG tablet Take 325 mg by mouth every evening. Take 30 min prior to Niaspan.    Historical Provider, MD  benazepril (LOTENSIN) 20 MG tablet Take 20 mg by mouth every morning.     Historical Provider, MD  benztropine (COGENTIN) 1 MG tablet Take 1 mg by mouth 2 (two) times daily.     Historical Provider, MD  cholecalciferol (VITAMIN D) 1000 UNITS tablet Take 1,000 Units by mouth every morning.     Historical Provider, MD  divalproex (DEPAKOTE ER) 500 MG 24 hr tablet Take 500 mg by mouth 2 (two) times daily.    Historical Provider, MD  docusate sodium (COLACE) 100 MG capsule Take 200 mg by mouth 2 (two) times daily.    Historical Provider, MD  ferrous sulfate 325 (65 FE) MG tablet Take 325 mg by mouth 2 (two) times daily.    Historical Provider, MD  hydrochlorothiazide (HYDRODIURIL) 25 MG tablet Take 25 mg by mouth every morning.     Historical Provider, MD  levothyroxine (SYNTHROID, LEVOTHROID) 75 MCG tablet Take 75 mcg by mouth daily before breakfast.     Historical Provider, MD  LORazepam (ATIVAN) 2  MG tablet Take 1 mg by mouth See admin instructions. Take 1mg  twice daily and 1mg  every 4 hours as needed for aggitation    Historical Provider, MD  meloxicam (MOBIC) 7.5 MG tablet Take 7.5 mg by mouth every morning.     Historical Provider, MD  metoprolol tartrate (LOPRESSOR) 25 MG tablet Take 25 mg by mouth 2 (two) times daily.    Historical Provider, MD  multivitamin-iron-minerals-folic acid (THERAPEUTIC-M) TABS tablet Take 1 tablet by mouth every morning.     Historical Provider, MD  niacin (NIASPAN) 750 MG CR tablet Take 750 mg by mouth every evening.     Historical Provider, MD  omeprazole (PRILOSEC) 20 MG capsule Take 20 mg by mouth every morning.     Historical  Provider, MD  ondansetron (ZOFRAN-ODT) 4 MG disintegrating tablet Take 1 tablet (4 mg total) by mouth every 8 (eight) hours as needed for nausea or vomiting. 09/02/13   Arman FilterGail K Schulz, NP  polyethylene glycol (MIRALAX / Ethelene HalGLYCOLAX) packet Take 17 g by mouth every morning.     Historical Provider, MD  potassium chloride (K-DUR) 10 MEQ tablet Take 10 mEq by mouth every morning.     Historical Provider, MD  QUEtiapine (SEROQUEL) 200 MG tablet Take 200 mg by mouth 2 (two) times daily.    Historical Provider, MD  sertraline (ZOLOFT) 100 MG tablet Take 150 mg by mouth every morning.     Historical Provider, MD  simvastatin (ZOCOR) 40 MG tablet Take 40 mg by mouth every evening.    Historical Provider, MD  tobramycin-dexamethasone [facility](TOBRADEX) ophthalmic solution Place 1 drop into both eyes 4 (four) times daily.      Historical Provider, MD  zolpidem (AMBIEN) 10 MG tablet Take 10 mg by mouth at bedtime as needed for sleep.    Historical Provider, MD   BP 138/83  Pulse 103  Temp(Src) 97.4 F (36.3 C) (Axillary)  Resp 25  SpO2 100%  Vital signs normal except tachycardia  Physical Exam  Nursing note and vitals reviewed. Constitutional: He is oriented to person, place, and time. He appears well-developed and well-nourished.  Non-toxic appearance. He does not appear ill. No distress.  shivering  HENT:  Head: Normocephalic and atraumatic.  Right Ear: External ear normal.  Left Ear: External ear normal.  Nose: Nose normal. No mucosal edema or rhinorrhea.  Mouth/Throat: Oropharynx is clear and moist and mucous membranes are normal. No dental abscesses or uvula swelling.  Eyes: Conjunctivae and EOM are normal. Pupils are equal, round, and reactive to light.  Neck: Normal range of motion and full passive range of motion without pain. Neck supple.  Cardiovascular: Normal rate, regular rhythm and normal heart sounds.  Exam reveals no gallop and no friction rub.   No murmur heard. Pulmonary/Chest: Effort  normal and breath sounds normal. No respiratory distress. He has no wheezes. He has no rhonchi. He has no rales. He exhibits no tenderness and no crepitus.  Abdominal: Soft. Normal appearance and bowel sounds are normal. He exhibits no distension. There is no tenderness. There is no rebound and no guarding.  Musculoskeletal: Normal range of motion. He exhibits no edema and no tenderness.  Moves all extremities well.   Neurological: He is alert and oriented to person, place, and time. He has normal strength. No cranial nerve deficit.  Skin: Skin is warm, dry and intact. No rash noted. No erythema. No pallor.  Psychiatric: His affect is labile. His speech is tangential. Cognition and memory are impaired.  Pt doesn't answer questions appropriately, when asked if he lives alone he states "don't say live" and "midnight is dark"    ED Course  Procedures (including critical care time)  Medications  acetaminophen (TYLENOL) tablet 650 mg (not administered)  ibuprofen (ADVIL,MOTRIN) tablet 600 mg (not administered)  zolpidem (AMBIEN) tablet 10 mg (not administered)  nicotine (NICODERM CQ - dosed in mg/24 hours) patch 21 mg (21 mg Transdermal Patch Applied 08/02/14 1130)  ondansetron (ZOFRAN) tablet 4 mg (4 mg Oral Given 08/02/14 0851)  alum & mag hydroxide-simeth (MAALOX/MYLANTA) 200-200-20 MG/5ML suspension 30 mL (not administered)  ziprasidone (GEODON) injection 10 mg (0 mg Intramuscular Hold 08/02/14 1130)  dextrose 5 %-0.9 % sodium chloride infusion (not administered)  ondansetron (ZOFRAN-ODT) disintegrating tablet 4 mg (0 mg Oral Duplicate 08/02/14 0851)     08:13 Kirsten, TSS called to discuss reason for consult  0820 I noticed his low blood glucose on his labs, patient given OJ and food to eat.   08:27 Kristen, TSS has tried to talk to patient and also notes his very tangential responses, will work on placement. Will need IVC since he can't sign consent   09:10 IVC papers filled out by  me  Patient initially was paranoid and did not want to eat. He however did start eating. However his blood sugar waxed and waned between the 50 range to 100. He did start eating better or he was in ED however he still would have some hypoglycemia. He does not appear to have access to any insulin or oral diabetic pills. Unfortunately conversation is difficult because patient starts talking about something unrelated to what you're talking about.  1600 patient not eating his dinner. His CBG has fallen again to 61. I have discussed with him about putting an IV in getting dextrose solution to keep his blood sugar high. Patient thinks he is getting a catheter in his penis. I finally conveyed to him he is getting IV in his arm. I will do CT of his head and talk to medicine about admitting him until his CBG is stabilizing he can get psychiatric admission.  16:18 Dr Rennis Petty, agrees with D5NS and CT scan. Will admit to med-surg, but wants him to stay in the ED until the CT scan is done.   Labs Review Results for orders placed during the hospital encounter of 08/02/14  ACETAMINOPHEN LEVEL      Result Value Ref Range   Acetaminophen (Tylenol), Serum <15.0  10 - 30 ug/mL  CBC      Result Value Ref Range   WBC 6.6  4.0 - 10.5 K/uL   RBC 4.02 (*) 4.22 - 5.81 MIL/uL   Hemoglobin 11.4 (*) 13.0 - 17.0 g/dL   HCT 96.0 (*) 45.4 - 09.8 %   MCV 89.1  78.0 - 100.0 fL   MCH 28.4  26.0 - 34.0 pg   MCHC 31.8  30.0 - 36.0 g/dL   RDW 11.9  14.7 - 82.9 %   Platelets 227  150 - 400 K/uL  COMPREHENSIVE METABOLIC PANEL      Result Value Ref Range   Sodium 140  137 - 147 mEq/L   Potassium 3.8  3.7 - 5.3 mEq/L   Chloride 100  96 - 112 mEq/L   CO2 26  19 - 32 mEq/L   Glucose, Bld 53 (*) 70 - 99 mg/dL   BUN 21  6 - 23 mg/dL   Creatinine, Ser 5.62  0.50 - 1.35 mg/dL   Calcium  9.1  8.4 - 10.5 mg/dL   Total Protein 7.6  6.0 - 8.3 g/dL   Albumin 4.0  3.5 - 5.2 g/dL   AST 62 (*) 0 - 37 U/L   ALT 29  0 - 53 U/L    Alkaline Phosphatase 74  39 - 117 U/L   Total Bilirubin 0.2 (*) 0.3 - 1.2 mg/dL   GFR calc non Af Amer 80 (*) >90 mL/min   GFR calc Af Amer >90  >90 mL/min   Anion gap 14  5 - 15  ETHANOL      Result Value Ref Range   Alcohol, Ethyl (B) <11  0 - 11 mg/dL  SALICYLATE LEVEL      Result Value Ref Range   Salicylate Lvl <2.0 (*) 2.8 - 20.0 mg/dL  URINE RAPID DRUG SCREEN (HOSP PERFORMED)      Result Value Ref Range   Opiates NONE DETECTED  NONE DETECTED   Cocaine NONE DETECTED  NONE DETECTED   Benzodiazepines NONE DETECTED  NONE DETECTED   Amphetamines NONE DETECTED  NONE DETECTED   Tetrahydrocannabinol NONE DETECTED  NONE DETECTED   Barbiturates NONE DETECTED  NONE DETECTED  VALPROIC ACID LEVEL      Result Value Ref Range   Valproic Acid Lvl 22.8 (*) 50.0 - 100.0 ug/mL  CBG MONITORING, ED      Result Value Ref Range   Glucose-Capillary 74  70 - 99 mg/dL  CBG MONITORING, ED      Result Value Ref Range   Glucose-Capillary 53 (*) 70 - 99 mg/dL  CBG MONITORING, ED      Result Value Ref Range   Glucose-Capillary 86  70 - 99 mg/dL  CBG MONITORING, ED      Result Value Ref Range   Glucose-Capillary 67 (*) 70 - 99 mg/dL  CBG MONITORING, ED      Result Value Ref Range   Glucose-Capillary 49 (*) 70 - 99 mg/dL  CBG MONITORING, ED      Result Value Ref Range   Glucose-Capillary 102 (*) 70 - 99 mg/dL  CBG MONITORING, ED      Result Value Ref Range   Glucose-Capillary 85  70 - 99 mg/dL  CBG MONITORING, ED      Result Value Ref Range   Glucose-Capillary 61 (*) 70 - 99 mg/dL   Laboratory interpretation all normal except mild anemia, hypoglcyemia     Imaging Review No results found.   EKG Interpretation   Date/Time:  Friday August 02 2014 07:59:05 EDT Ventricular Rate:  90 PR Interval:  155 QRS Duration: 94 QT Interval:  382 QTC Calculation: 467 R Axis:   82 Text Interpretation:  Sinus rhythm Consider left atrial enlargement Left  ventricular hypertrophy No significant  change since last tracing 30 Oct 2012 Confirmed by [facility]  MD-I, Cimberly Stoffel ([id number]) on 08/02/2014 8:07:49 AM      MDM   Final diagnoses:  Psychosis, unspecified psychosis type  Hypoglycemia    Plan admission   Devoria Albe, MD, FACEP   CRITICAL CARE Performed by: Ward Givens Total critical care time: 47 min Critical care time was exclusive of separately billable procedures and treating other patients. Critical care was necessary to treat or prevent imminent or life-threatening deterioration. Critical care was time spent personally by me on the following activities: development of treatment plan with patient and/or surrogate as well as nursing, discussions with consultants, evaluation of patient's response to treatment, examination of patient, obtaining history from patient  or surrogate, ordering and performing treatments and interventions, ordering and review of laboratory studies, ordering and review of radiographic studies, pulse oximetry and re-evaluation of patient's condition.     Ward Givens, MD 08/02/14 1630

## 2014-08-02 NOTE — Care Management (Signed)
ED CM spoke with Dr. Jerral RalphGhimire concerning patient. Patient was brought in by [employer] at approximately 7am this morning. As per [employer] patient found wandering in a cemetary naked. Contacted emergency contact Lauris Chromanora Balducci who informed [place] that [facility] a [facility]. ED CM contacted Lat[facility] at [facility], facility not aware he was missing. Requesting a copy of recent MAR.  Patient is being admitted to Med/Surg for hypoglycemia. CM/CSW will continue to follow on unit for disposition plan.

## 2014-08-02 NOTE — BH Assessment (Addendum)
Tele Assessment Note   [name] is an 53 y.o. male that was seen via tele assessment by this clinician once clinical information gathered from EDP Knapp at [facility] at [phone].  Tele assessment completed at 0815.  Per [facility], pt was found running through a cemetary naked last night.  When I attempted to assess the pt, he was not oriented, had disorganized, tangential speech and flight of ideas.  Pt's answers to MSE were "graveyards close by," he referred to himself as "[name]," "[place]," "[employer]," and "GED Test."  Pt is clearly disoriented and unable to answer assessment questions.  Pt appears psychotic.  Pt did deny taking his medications to Dr. Lynelle Doctor and has a hx of Bipolar Disorder from his record.  Inpatient psychiatric stabilization is recommended for the pt.  EDP Knapp in agreement with disposition and is also placing pt under IVC as he is psychotic and unable to make decisions on his own.  Consulted Lloyd Huger Mashburn @ 0900 who agreed inpatient treatment warranted.  TTS to seek placement for pt. Updated ED and TTS staff.  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 Bipolar 1 disorder   . Hypertension   . Thyroid disease   . GERD (gastroesophageal reflux disease)   . Obstructive sleep apnea   . COPD (chronic obstructive pulmonary disease)    Axis IV: other psychosocial or environmental problems Axis V: 21-30 behavior considerably influenced by delusions or hallucinations OR serious impairment in judgment, communication OR inability to function in almost all areas  Past Medical History:  Past Medical History  Diagnosis Date  . Bipolar 1 disorder   . Hypertension   . Thyroid disease   . GERD (gastroesophageal reflux disease)   . Obstructive sleep apnea   . COPD (chronic obstructive pulmonary disease)     History reviewed. No pertinent past surgical history.  Family History:   Family History  Problem Relation Age of Onset  . Cirrhosis Mother   . Hypertension Mother   . Diabetes Mellitus II Mother     Social History:  reports that he has been smoking Cigarettes.  He has been smoking about 0.50 packs per day. He does not have any smokeless tobacco history on file. He reports that he drinks alcohol. He reports that he does not use illicit drugs.  Additional Social History:  Alcohol / Drug Use Pain Medications: see med list Prescriptions: see med list Over the Counter: see med list History of alcohol / drug use?:  (UTA) Longest period of sobriety (when/how long):  (UTA) Negative Consequences of Use:  (UTA) Withdrawal Symptoms:  (UTA)  CIWA: CIWA-Ar BP: 138/83 mmHg Pulse Rate: 103 COWS:    PATIENT STRENGTHS: (choose at least two) None known  Allergies: No Known Allergies  Home Medications:  (Not in a hospital admission)  OB/GYN Status:  No LMP for male patient.  General Assessment Data Location of Assessment: [facility] ED Is this a Tele or Face-to-Face Assessment?: Tele Assessment Is this an Initial Assessment or a Re-assessment for this encounter?: Initial Assessment Living Arrangements: Other (Comment) (Unknown) Can pt return to current living arrangement?:  (Unknown) Admission Status: Involuntary Is patient capable of signing voluntary admission?: No Transfer from: Other (Comment) (EMS) Referral Source: Other (EMS)     [facility] Crisis Care Plan Living Arrangements: Other (Comment) (Unknown) Name of Psychiatrist: Unknown Name of Therapist: Unknown  Education Status Is patient currently in school?:  (  UTA)  Risk to self with the past 6 months Suicidal Ideation:  (UTA) Suicidal Intent:  (UTA) Is patient at risk for suicide?:  (UTA) Suicidal Plan?:  (UTA) Access to Means:  (UTA) What has been your use of drugs/alcohol within the last 12 months?: UTA Previous Attempts/Gestures:  (UTA) How many times?:  (UTA) Other Self Harm Risks:   (UTA) Triggers for Past Attempts:  (UTA) Intentional Self Injurious Behavior:  (UTA) Family Suicide History:  (UTA) Recent stressful life event(s):  (UTA) Persecutory voices/beliefs?:  (UTA) Depression:  (UTA) Depression Symptoms:  (UTA) Substance abuse history and/or treatment for substance abuse?:  (UTA) Suicide prevention information given to non-admitted patients:  (UTA)  Risk to Others within the past 6 months Homicidal Ideation:  (UTA) Thoughts of Harm to Others:  (UTA) Current Homicidal Intent:  (UTA) Current Homicidal Plan:  (UTA) Access to Homicidal Means:  (UTA) Identified Victim:  (UTA) History of harm to others?:  (UTA) Assessment of Violence:  (UTA) Violent Behavior Description: na - pt calm, cooperative Does patient have access to weapons?:  (UTA) Criminal Charges Pending?:  (Unk) Does patient have a court date:  (Unk)  Psychosis Hallucinations:  (pt appears to be responding to internal stimuli) Delusions: Unspecified (Referring to self as Surveyor, miningBatman)  Mental Status Report Appear/Hygiene: Disheveled;In scrubs Eye Contact: Poor Motor Activity: Freedom of movement;Unremarkable Speech: Incoherent Level of Consciousness: Alert Mood: Other (Comment) (UTA) Affect: Other (Comment) (UTA) Anxiety Level:  (UTA) Thought Processes: Tangential;Flight of Ideas Judgement: Impaired Orientation: Not oriented Obsessive Compulsive Thoughts/Behaviors: Unable to Assess  Cognitive Functioning Concentration: Unable to Assess Memory: Unable to Assess IQ:  (UTA) Insight: Unable to Assess Impulse Control: Unable to Assess Appetite:  (UTA) Weight Loss:  (Unk) Weight Gain:  (Unk) Sleep: Unable to Assess Total Hours of Sleep:  (Unk) Vegetative Symptoms: Unable to Assess  ADLScreening [facility]) Patient's cognitive ability adequate to safely complete daily activities?:  (UTA) Patient able to express need for assistance with ADLs?:  (UTA) Independently performs  ADLs?:  (UTA)  Prior Inpatient Therapy Prior Inpatient Therapy:  (Unk) Prior Therapy Dates: unk Prior Therapy Facilty/Provider(s): unk Reason for Treatment: unk  Prior Outpatient Therapy Prior Outpatient Therapy:  (unk) Prior Therapy Dates: unk Prior Therapy Facilty/Provider(s): unk Reason for Treatment: unk  ADL Screening (condition at time of admission) Patient's cognitive ability adequate to safely complete daily activities?:  (UTA) Is the patient deaf or have difficulty hearing?:  (UTA) Does the patient have difficulty seeing, even when wearing glasses/contacts?:  (UTA) Does the patient have difficulty concentrating, remembering, or making decisions?:  (UTA) Patient able to express need for assistance with ADLs?:  (UTA) Does the patient have difficulty dressing or bathing?:  (UTA) Independently performs ADLs?:  (UTA) Does the patient have difficulty walking or climbing stairs?:  (UTA)  Home Assistive Devices/Equipment Home Assistive Devices/Equipment:  (UTA)    Abuse/Neglect Assessment (Assessment to be complete while patient is alone) Physical Abuse:  (UTA) Verbal Abuse:  (UTA) Sexual Abuse:  (UTA) Exploitation of patient/patient's resources:  (UTA) Self-Neglect:  (UTA) Possible abuse reported to::  (UTA) Values / Beliefs Cultural Requests During Hospitalization:  (UTA) Spiritual Requests During Hospitalization:  (UTA) Consults Spiritual Care Consult Needed: No Social Work Consult Needed: No Merchant navy officerAdvance Directives (For Healthcare) Does patient have an advance directive?:  (UTA) Would patient like information on creating an advanced directive?:  (UTA)    Additional Information 1:1 In Past 12 Months?:  (UTA) CIRT Risk: No Elopement Risk: No Does patient have medical  clearance?: Yes     Disposition:  Disposition Initial Assessment Completed for this Encounter: Yes Disposition of Patient: Referred to;Inpatient treatment program Type of inpatient treatment  program: Adult  Casimer LaniusKristen Raynisha Avilla, MS, [facility] Licensed Professional Counselor Therapeutic Triage Specialist [facility] Phone: [phone] Fax: [phone]  08/02/2014 9:01 AM

## 2014-08-02 NOTE — ED Notes (Signed)
Pt given 240cc orange juice.

## 2014-08-02 NOTE — ED Notes (Signed)
Dr. Lynelle DoctorKnapp made aware of patient's blood sugar. Lunch meal tray ordered.

## 2014-08-02 NOTE — Progress Notes (Signed)
Spoke with RNCM re: pt.  [facility] from [facility] and was found wandering around naked [place] a [place] at around 7:00 am today by [employer].Hospital staff did not know that pt was from facility until sister was called and confirmed his residence.  [facility] was not aware that pt was missing when CSW called them at 5:00 pm.  CSW will continue to follow for d/c planning.

## 2014-08-02 NOTE — ED Notes (Signed)
[employer] at patient's bedside to serve patient IVC paperwork.

## 2014-08-02 NOTE — Progress Notes (Signed)
Dr. Jerral RalphGhimire notified of arrival.

## 2014-08-02 NOTE — ED Notes (Signed)
Meal tray ordered for patient.

## 2014-08-02 NOTE — ED Notes (Signed)
Pt walked out of room and down hall despite multiple attempts to redirect. Pt went to POD B nursing station. In attempt to redirect pt to room, pt reports "I want to go ride in the ambulance." Pt attempted to walk to the ambulance doors but was met by ED security. ED security able to redirect pt back to his room.

## 2014-08-02 NOTE — ED Notes (Signed)
Patient transported to CT 

## 2014-08-02 NOTE — ED Notes (Signed)
Pt drank 118 mL orange juice.  Pt refusing breakfast tray or more orange juice.

## 2014-08-02 NOTE — Progress Notes (Signed)
Arrived to unit 5w32 from ED. Disoriented x4, sitter at bedside
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
## 2014-08-02 NOTE — Progress Notes (Addendum)
Pt lost his iv site and is refusing to get a new site started. Pt is also refusing for his labs to be drawn. Paged provider on call. Awaiting further orders.

## 2014-08-02 NOTE — ED Notes (Signed)
Staffing contacted again about sitter. Stated that she was calling more units.

## 2014-08-02 NOTE — ED Notes (Signed)
Staffing notified for sitter 

## 2014-08-02 NOTE — ED Notes (Signed)
Pt states sisters number is [phone].

## 2014-08-03 DIAGNOSIS — F209 Schizophrenia, unspecified: Secondary | ICD-10-CM

## 2014-08-03 DIAGNOSIS — M6282 Rhabdomyolysis: Secondary | ICD-10-CM

## 2014-08-03 LAB — CBC
HCT: 33.7 % — ABNORMAL LOW (ref 39.0–52.0)
Hemoglobin: 10.8 g/dL — ABNORMAL LOW (ref 13.0–17.0)
MCH: 27.5 pg (ref 26.0–34.0)
MCHC: 32 g/dL (ref 30.0–36.0)
MCV: 85.8 fL (ref 78.0–100.0)
Platelets: 217 10*3/uL (ref 150–400)
RBC: 3.93 MIL/uL — ABNORMAL LOW (ref 4.22–5.81)
RDW: 14.9 % (ref 11.5–15.5)
WBC: 4.2 10*3/uL (ref 4.0–10.5)

## 2014-08-03 LAB — GLUCOSE, CAPILLARY
GLUCOSE-CAPILLARY: 99 mg/dL (ref 70–99)
GLUCOSE-CAPILLARY: 119 mg/dL — AB (ref 70–99)
GLUCOSE-CAPILLARY: 109 mg/dL — AB (ref 70–99)
Glucose-Capillary: 108 mg/dL — ABNORMAL HIGH (ref 70–99)
Glucose-Capillary: 90 mg/dL (ref 70–99)
Glucose-Capillary: 68 mg/dL — ABNORMAL LOW (ref 70–99)
Glucose-Capillary: 104 mg/dL — ABNORMAL HIGH (ref 70–99)
Glucose-Capillary: 78 mg/dL (ref 70–99)
Glucose-Capillary: 93 mg/dL (ref 70–99)
Glucose-Capillary: 113 mg/dL — ABNORMAL HIGH (ref 70–99)
Glucose-Capillary: 114 mg/dL — ABNORMAL HIGH (ref 70–99)

## 2014-08-03 LAB — BASIC METABOLIC PANEL
ANION GAP: 12 (ref 5–15)
BUN: 13 mg/dL (ref 6–23)
CALCIUM: 9.2 mg/dL (ref 8.4–10.5)
CHLORIDE: 100 meq/L (ref 96–112)
CO2: 25 mEq/L (ref 19–32)
CREATININE: 1.02 mg/dL (ref 0.50–1.35)
GFR calc Af Amer: >90 mL/min (ref >90)
GFR calc non Af Amer: 82 mL/min — ABNORMAL LOW (ref >90)
Glucose, Bld: 103 mg/dL — ABNORMAL HIGH (ref 70–99)
Potassium: 4.1 mEq/L (ref 3.7–5.3)
Sodium: 137 mEq/L (ref 137–147)

## 2014-08-03 LAB — HEMOGLOBIN A1C
Hgb A1c MFr Bld: 5.9 % — ABNORMAL HIGH (ref <5.7)
Mean Plasma Glucose: 123 mg/dL — ABNORMAL HIGH (ref <117)

## 2014-08-03 LAB — CORTISOL: CORTISOL PLASMA: 9.3 ug/dL

## 2014-08-03 LAB — TSH: TSH: 3.97 u[IU]/mL (ref 0.350–4.500)

## 2014-08-03 LAB — CK: CK TOTAL: 1064 U/L — AB (ref 7–232)

## 2014-08-03 NOTE — BH Assessment (Signed)
[name], AC at [facility], confirms adult unit is currently at capacity. Contacted the following facilities for placement:  BED AVAILABLE, FAXED CLINICAL INFORMATION: [facility], per [name] [facility], per [name] [facility], per [name]  AT CAPACITY: [facility], per [name] [facility], per [name], per [name], per [name], per [name], per [name], per [name], per [name], per [name] [facility], per [name] [facility], per [name] [facility], per [name] [facility], per [name] [facility], per [name] [facility], per [name] [facility], per [name] [facility], per [facility].   [facility] Navaeh Kehres Jr, LPC, NCC Triage Specialist [phone] 

## 2014-08-03 NOTE — Progress Notes (Signed)
PATIENT DETAILS Name: [name] Age: 53 y.o. Sex: male Date of Birth: 1961/04/25 Admit Date: 08/02/2014 Admitting Physician Dewayne ShorterShanker Levora DredgeM Ghimire, MD PCP:Pcp Not In System  Subjective: Hypoglycemia slowly resolving. Answers some questions appropriately  Assessment/Plan: Principal Problem:   Hypoglycemia -c/w D5NS-encourage oral intake -A1C 5.9, sulfonylurea level pending  Active Problems: Altered Mental Status -suspect secondary to underlying psych issue. CT head neg, UA neg for UTI, Neck supple-no other foci of infection apparent.  -just restarted psych meds on admission  Schozphrenia -appreciate Psych consult-recommendations are to continue with current medications, and for inpatient psych placement on discharge  Rhabdomyolysis -better post hydration with IVF-continue to hold statin. Although CK elevated-no rigidity/fever to suggest Neuroleptic Malignant Synd  HTN (hypertension)  - Will continue prior antihypertensive medications-holding parameters ordered. Continue to follow BP and adjust medications accordingly   Hypothyroidism - Continue with levothyroxine  Obstructive sleep apnea  - CPAP when necessary -refusing per RT note  Disposition: Remain inpatient  DVT Prophylaxis: Prophylactic Heparin   Code Status: Full code   Family Communication None at bedside-spoke with sister on admission over the phone  Procedures:  None  CONSULTS:  psychiatry  Time spent 40 minutes-which includes 50% of the time with face-to-face with patient/ family and coordinating care related to the above assessment and plan.    MEDICATIONS: Scheduled Meds: . amLODipine  10 mg Oral q morning - 10a  . aspirin  325 mg Oral QPM  . benazepril  20 mg Oral q morning - 10a  . benztropine  1 mg Oral BID  . divalproex  500 mg Oral BID  . heparin  5,000 Units Subcutaneous 3 times per day  . hydrochlorothiazide  25 mg Oral q morning - 10a  . levothyroxine  75 mcg Oral QAC  breakfast  . LORazepam  1 mg Oral TID  . metoprolol tartrate  50 mg Oral BID  . multivitamin with minerals  1 tablet Oral Daily  . pantoprazole  40 mg Oral Daily  . polyethylene glycol  17 g Oral q morning - 10a  . potassium chloride  10 mEq Oral q morning - 10a  . QUEtiapine  200 mg Oral BID  . sertraline  150 mg Oral q morning - 10a  . tobramycin-dexamethasone  1 drop Both Eyes QID   Continuous Infusions: . dextrose 5 % and 0.9% NaCl 1,000 mL (08/03/14 1254)   PRN Meds:.acetaminophen, acetaminophen, albuterol, alum & mag hydroxide-simeth, dextrose, guaiFENesin-dextromethorphan, LORazepam, morphine injection, ondansetron (ZOFRAN) IV, ondansetron, oxyCODONE, zolpidem  Antibiotics: Anti-infectives   None       PHYSICAL EXAM: Vital signs in last 24 hours: Filed Vitals:   08/02/14 1756 08/02/14 2207 08/03/14 0622 08/03/14 1528  BP: 134/100 152/79 124/60 111/66  Pulse: 84 86 69 67  Temp:  98.2 F (36.8 C) 98.7 F (37.1 C) 98.1 F (36.7 C)  TempSrc: Oral Oral Oral Oral  Resp: 20 20 18 18   Height: 5\' 6"  (1.676 m)     Weight: 67.677 kg (149 lb 3.2 oz)     SpO2: 98% 98% 100% 97%    Weight change:  Filed Weights   08/02/14 1756  Weight: 67.677 kg (149 lb 3.2 oz)   Body mass index is 24.09 kg/(m^2).   Gen Exam: Awake and, follows almost all commands today, answers almost all questions appropriately.   Neck: Supple, No JVD.   Chest: B/L Clear.   CVS: S1 S2 Regular, no murmurs.  Abdomen: soft,  BS +, non tender, non distended.  Extremities: no edema, lower extremities warm to touch. Neurologic: Non Focal.   Skin: No Rash.   Wounds: N/A.    Intake/Output from previous day:  Intake/Output Summary (Last 24 hours) at 08/03/14 1604 Last data filed at 08/03/14 0446  Gross per 24 hour  Intake    240 ml  Output   1550 ml  Net  -1310 ml     LAB RESULTS: CBC  Recent Labs Lab 08/02/14 0634 08/03/14 0259  WBC 6.6 4.2  HGB 11.4* 10.8*  HCT 35.8* 33.7*  PLT 227 217    MCV 89.1 85.8  MCH 28.4 27.5  MCHC 31.8 32.0  RDW 15.3 14.9    Chemistries   Recent Labs Lab 08/02/14 0634 08/03/14 0259  NA 140 137  K 3.8 4.1  CL 100 100  CO2 26 25  GLUCOSE 53* 103*  BUN 21 13  CREATININE 1.04 1.02  CALCIUM 9.1 9.2    CBG:  Recent Labs Lab 08/03/14 0643 08/03/14 0825 08/03/14 1030 08/03/14 1222 08/03/14 1527  GLUCAP 104* 78 93 113* 99    GFR Estimated Creatinine Clearance: 75.6 ml/min (by C-G formula based on Cr of 1.02).  Coagulation profile No results found for this basename: INR, PROTIME,  in the last 168 hours  Cardiac Enzymes No results found for this basename: CK, CKMB, TROPONINI, MYOGLOBIN,  in the last 168 hours  No components found with this basename: POCBNP,  No results found for this basename: DDIMER,  in the last 72 hours  Recent Labs  08/03/14 0259  HGBA1C 5.9*   No results found for this basename: CHOL, HDL, LDLCALC, TRIG, CHOLHDL, LDLDIRECT,  in the last 72 hours  Recent Labs  08/03/14 0259  TSH 3.970   No results found for this basename: VITAMINB12, FOLATE, FERRITIN, TIBC, IRON, RETICCTPCT,  in the last 72 hours No results found for this basename: LIPASE, AMYLASE,  in the last 72 hours  Urine Studies No results found for this basename: UACOL, UAPR, USPG, UPH, UTP, UGL, UKET, UBIL, UHGB, UNIT, UROB, ULEU, UEPI, UWBC, URBC, UBAC, CAST, CRYS, UCOM, BILUA,  in the last 72 hours  MICROBIOLOGY: No results found for this or any previous visit (from the past 240 hour(s)).  RADIOLOGY STUDIES/RESULTS: Ct Head Wo Contrast  08/02/2014   CLINICAL DATA:  Altered mental status, disoriented; history of bipolar disorder and hypertension and thyroid disease  EXAM: CT HEAD WITHOUT CONTRAST  TECHNIQUE: Contiguous axial images were obtained from the base of the skull through the vertex without intravenous contrast.  COMPARISON:  Noncontrast CT scan of the brain of August 01, 2013  FINDINGS: The ventricles are normal in size and  position. There is no intracranial hemorrhage nor intracranial mass effect. There is no acute ischemic change. The cerebellum and brainstem are unremarkable.  The observed paranasal sinuses and mastoid air cells are clear. There is no acute skull fracture.  IMPRESSION: There is no acute intracranial abnormality. There is no acute skull fracture.   Electronically Signed   By: David  [place]azil[place]   On: 08/02/2014 17:17    Jeoffrey MassedGHIMIRE,SHANKER, MD  [facility] Pager:[phone]  If 7PM-7AM, please contact night-coverage [email or web address] Password [email or web address] 08/03/2014, 4:04 PM   LOS: 1 day

## 2014-08-03 NOTE — Consult Note (Signed)
[name] Consult   Reason for Consult:  Altered mental status, confusion Referring Physician:  Dr. Olga Millers [name] is an 53 y.o. male. Total Time spent with patient: 45 minutes  Assessment: AXIS I:  Schizophrenia AXIS II:  Deferred AXIS III:   Past Medical History  Diagnosis Date  . Bipolar 1 disorder   . Hypertension   . Thyroid disease   . GERD (gastroesophageal reflux disease)   . Obstructive sleep apnea   . COPD (chronic obstructive pulmonary disease)    AXIS IV:  other psychosocial or environmental problems AXIS V:  21-30 behavior considerably influenced by delusions or hallucinations OR serious impairment in judgment, communication OR inability to function in almost all areas  Plan:  Recommend psychiatric Inpatient admission when medically cleared.  Subjective:   [name] is a 53 y.o. male patient admitted with altered mental status.  HPI: Patient is 54 year old black male with history of schizophrenia or schizoaffective disorder. He was found wandering [place]. Apparently he left his assisted living facility and has not been compliant with medications. He is unable to answer question, is drowsy with slurred and garbled speech. He is oriented only to place. He has a history of neuroleptic malignant syndrome but does not have fever or stiffness. His hoome meds have been restarted  HPI Elements:   Location:  generalized. Quality:  severe. Severity:  severe. Timing:  acute. Duration:  years. Context:  off medications.  Past Psychiatric History: Past Medical History  Diagnosis Date  . Bipolar 1 disorder   . Hypertension   . Thyroid disease   . GERD (gastroesophageal reflux disease)   . Obstructive sleep apnea   . COPD (chronic obstructive pulmonary disease)     reports that he has been smoking Cigarettes.  He has been smoking about 0.50 packs per day. He does not have any smokeless tobacco history on file. He reports that he  drinks alcohol. He reports that he does not use illicit drugs. Family History  Problem Relation Age of Onset  . Cirrhosis Mother   . Hypertension Mother   . Diabetes Mellitus II Mother    Family History Substance Abuse:  (UTA) Family Supports:  (UTA) Living Arrangements: Other (Comment) (Unknown) Can pt return to current living arrangement?:  (Unknown) Abuse/Neglect [facility]) Physical Abuse:  (UTA) Verbal Abuse:  (UTA) Sexual Abuse:  (UTA) Allergies:  No Known Allergies   Objective: Blood pressure 124/60, pulse 69, temperature 98.7 F (37.1 C), temperature source Oral, resp. rate 18, height 5' 6"  (1.676 m), weight 149 lb 3.2 oz (67.677 kg), SpO2 100.00%.Body mass index is 24.09 kg/(m^2). Results for orders placed during the hospital encounter of 08/02/14 (from the past 72 hour(s))  URINE RAPID DRUG SCREEN (HOSP PERFORMED)     Status: None   Collection Time    08/02/14  6:25 AM      Result Value Ref Range   Opiates NONE DETECTED  NONE DETECTED   Cocaine NONE DETECTED  NONE DETECTED   Benzodiazepines NONE DETECTED  NONE DETECTED   Amphetamines NONE DETECTED  NONE DETECTED   Tetrahydrocannabinol NONE DETECTED  NONE DETECTED   Barbiturates NONE DETECTED  NONE DETECTED   Comment:            DRUG SCREEN FOR MEDICAL PURPOSES     ONLY.  IF CONFIRMATION IS NEEDED     FOR ANY PURPOSE, NOTIFY LAB     WITHIN 5 DAYS.  LOWEST DETECTABLE LIMITS     FOR URINE DRUG SCREEN     Drug Class       Cutoff (ng/mL)     Amphetamine      1000     Barbiturate      200     Benzodiazepine   564     Tricyclics       332     Opiates          300     Cocaine          300     THC              50  ACETAMINOPHEN LEVEL     Status: None   Collection Time    08/02/14  6:34 AM      Result Value Ref Range   Acetaminophen (Tylenol), Serum <15.0  10 - 30 ug/mL   Comment:            THERAPEUTIC CONCENTRATIONS VARY     SIGNIFICANTLY. A RANGE OF 10-30     ug/mL MAY BE AN EFFECTIVE      CONCENTRATION FOR MANY PATIENTS.     HOWEVER, SOME ARE BEST TREATED     AT CONCENTRATIONS OUTSIDE THIS     RANGE.     ACETAMINOPHEN CONCENTRATIONS     >150 ug/mL AT 4 HOURS AFTER     INGESTION AND >50 ug/mL AT 12     HOURS AFTER INGESTION ARE     OFTEN ASSOCIATED WITH TOXIC     REACTIONS.  CBC     Status: Abnormal   Collection Time    08/02/14  6:34 AM      Result Value Ref Range   WBC 6.6  4.0 - 10.5 K/uL   RBC 4.02 (*) 4.22 - 5.81 MIL/uL   Hemoglobin 11.4 (*) 13.0 - 17.0 g/dL   HCT 35.8 (*) 39.0 - 52.0 %   MCV 89.1  78.0 - 100.0 fL   MCH 28.4  26.0 - 34.0 pg   MCHC 31.8  30.0 - 36.0 g/dL   RDW 15.3  11.5 - 15.5 %   Platelets 227  150 - 400 K/uL  COMPREHENSIVE METABOLIC PANEL     Status: Abnormal   Collection Time    08/02/14  6:34 AM      Result Value Ref Range   Sodium 140  137 - 147 mEq/L   Potassium 3.8  3.7 - 5.3 mEq/L   Chloride 100  96 - 112 mEq/L   CO2 26  19 - 32 mEq/L   Glucose, Bld 53 (*) 70 - 99 mg/dL   BUN 21  6 - 23 mg/dL   Creatinine, Ser 1.04  0.50 - 1.35 mg/dL   Calcium 9.1  8.4 - 10.5 mg/dL   Total Protein 7.6  6.0 - 8.3 g/dL   Albumin 4.0  3.5 - 5.2 g/dL   AST 62 (*) 0 - 37 U/L   ALT 29  0 - 53 U/L   Alkaline Phosphatase 74  39 - 117 U/L   Total Bilirubin 0.2 (*) 0.3 - 1.2 mg/dL   GFR calc non Af Amer 80 (*) >90 mL/min   GFR calc Af Amer >90  >90 mL/min   Comment: (NOTE)     The eGFR has been calculated using the CKD EPI equation.     This calculation has not been validated in all clinical situations.     eGFR's persistently <90 mL/min  signify possible Chronic Kidney     Disease.   Anion gap 14  5 - 15  ETHANOL     Status: None   Collection Time    08/02/14  6:34 AM      Result Value Ref Range   Alcohol, Ethyl (B) <11  0 - 11 mg/dL   Comment:            LOWEST DETECTABLE LIMIT FOR     SERUM ALCOHOL IS 11 mg/dL     FOR MEDICAL PURPOSES ONLY  SALICYLATE LEVEL     Status: Abnormal   Collection Time    08/02/14  6:34 AM      Result Value  Ref Range   Salicylate Lvl <0.9 (*) 2.8 - 20.0 mg/dL  CBG MONITORING, ED     Status: None   Collection Time    08/02/14  8:48 AM      Result Value Ref Range   Glucose-Capillary 74  70 - 99 mg/dL  CBG MONITORING, ED     Status: Abnormal   Collection Time    08/02/14  9:59 AM      Result Value Ref Range   Glucose-Capillary 53 (*) 70 - 99 mg/dL  VALPROIC ACID LEVEL     Status: Abnormal   Collection Time    08/02/14 11:28 AM      Result Value Ref Range   Valproic Acid Lvl 22.8 (*) 50.0 - 100.0 ug/mL  CBG MONITORING, ED     Status: None   Collection Time    08/02/14 12:02 PM      Result Value Ref Range   Glucose-Capillary 86  70 - 99 mg/dL  CBG MONITORING, ED     Status: Abnormal   Collection Time    08/02/14 12:37 PM      Result Value Ref Range   Glucose-Capillary 67 (*) 70 - 99 mg/dL  CBG MONITORING, ED     Status: Abnormal   Collection Time    08/02/14  1:28 PM      Result Value Ref Range   Glucose-Capillary 49 (*) 70 - 99 mg/dL  CBG MONITORING, ED     Status: Abnormal   Collection Time    08/02/14  2:29 PM      Result Value Ref Range   Glucose-Capillary 102 (*) 70 - 99 mg/dL  CBG MONITORING, ED     Status: None   Collection Time    08/02/14  3:16 PM      Result Value Ref Range   Glucose-Capillary 85  70 - 99 mg/dL  CBG MONITORING, ED     Status: Abnormal   Collection Time    08/02/14  3:51 PM      Result Value Ref Range   Glucose-Capillary 61 (*) 70 - 99 mg/dL  CK     Status: Abnormal   Collection Time    08/02/14  4:29 PM      Result Value Ref Range   Total CK 1531 (*) 7 - 232 U/L  CBG MONITORING, ED     Status: None   Collection Time    08/02/14  5:20 PM      Result Value Ref Range   Glucose-Capillary 72  70 - 99 mg/dL  GLUCOSE, CAPILLARY     Status: None   Collection Time    08/02/14  7:56 PM      Result Value Ref Range   Glucose-Capillary 73  70 - 99 mg/dL  GLUCOSE, CAPILLARY  Status: Abnormal   Collection Time    08/02/14  9:57 PM      Result  Value Ref Range   Glucose-Capillary 56 (*) 70 - 99 mg/dL  GLUCOSE, CAPILLARY     Status: None   Collection Time    08/02/14 10:23 PM      Result Value Ref Range   Glucose-Capillary 70  70 - 99 mg/dL  GLUCOSE, CAPILLARY     Status: Abnormal   Collection Time    08/02/14 11:58 PM      Result Value Ref Range   Glucose-Capillary 118 (*) 70 - 99 mg/dL   Comment 1 Notify RN     Comment 2 Documented in Chart    GLUCOSE, CAPILLARY     Status: Abnormal   Collection Time    08/03/14  2:03 AM      Result Value Ref Range   Glucose-Capillary 108 (*) 70 - 99 mg/dL   Comment 1 Notify RN     Comment 2 Documented in Chart    TSH     Status: None   Collection Time    08/03/14  2:59 AM      Result Value Ref Range   TSH 3.970  0.350 - 4.500 uIU/mL  HEMOGLOBIN A1C     Status: Abnormal   Collection Time    08/03/14  2:59 AM      Result Value Ref Range   Hemoglobin A1C 5.9 (*) <5.7 %   Comment: (NOTE)                                                                               According to the ADA Clinical Practice Recommendations for 2011, when     HbA1c is used as a screening test:      >=6.5%   Diagnostic of Diabetes Mellitus               (if abnormal result is confirmed)     5.7-6.4%   Increased risk of developing Diabetes Mellitus     References:Diagnosis and Classification of Diabetes Mellitus,Diabetes     YDXA,1287,86([facility] 1):S62-S69 and Standards of Medical Care in             Diabetes - 2011,Diabetes Care,2011,34 (Suppl 1):S11-S61.   Mean Plasma Glucose 123 (*) <117 mg/dL   Comment: Performed at [facility]     Status: Abnormal   Collection Time    08/03/14  2:59 AM      Result Value Ref Range   Sodium 137  137 - 147 mEq/L   Potassium 4.1  3.7 - 5.3 mEq/L   Chloride 100  96 - 112 mEq/L   CO2 25  19 - 32 mEq/L   Glucose, Bld 103 (*) 70 - 99 mg/dL   BUN 13  6 - 23 mg/dL   Creatinine, Ser 1.02  0.50 - 1.35 mg/dL   Calcium 9.2  8.4 - 10.5 mg/dL    GFR calc non Af Amer 82 (*) >90 mL/min   GFR calc Af Amer >90  >90 mL/min   Comment: (NOTE)     The eGFR has been calculated using the CKD EPI equation.  This calculation has not been validated in all clinical situations.     eGFR's persistently <90 mL/min signify possible Chronic Kidney     Disease.   Anion gap 12  5 - 15  CBC     Status: Abnormal   Collection Time    08/03/14  2:59 AM      Result Value Ref Range   WBC 4.2  4.0 - 10.5 K/uL   RBC 3.93 (*) 4.22 - 5.81 MIL/uL   Hemoglobin 10.8 (*) 13.0 - 17.0 g/dL   HCT 33.7 (*) 39.0 - 52.0 %   MCV 85.8  78.0 - 100.0 fL   MCH 27.5  26.0 - 34.0 pg   MCHC 32.0  30.0 - 36.0 g/dL   RDW 14.9  11.5 - 15.5 %   Platelets 217  150 - 400 K/uL  CK     Status: Abnormal   Collection Time    08/03/14  2:59 AM      Result Value Ref Range   Total CK 1064 (*) 7 - 232 U/L  GLUCOSE, CAPILLARY     Status: None   Collection Time    08/03/14  3:43 AM      Result Value Ref Range   Glucose-Capillary 90  70 - 99 mg/dL  GLUCOSE, CAPILLARY     Status: Abnormal   Collection Time    08/03/14  6:17 AM      Result Value Ref Range   Glucose-Capillary 68 (*) 70 - 99 mg/dL  GLUCOSE, CAPILLARY     Status: Abnormal   Collection Time    08/03/14  6:43 AM      Result Value Ref Range   Glucose-Capillary 104 (*) 70 - 99 mg/dL  GLUCOSE, CAPILLARY     Status: None   Collection Time    08/03/14  8:25 AM      Result Value Ref Range   Glucose-Capillary 78  70 - 99 mg/dL  GLUCOSE, CAPILLARY     Status: None   Collection Time    08/03/14 10:30 AM      Result Value Ref Range   Glucose-Capillary 93  70 - 99 mg/dL  GLUCOSE, CAPILLARY     Status: Abnormal   Collection Time    08/03/14 12:22 PM      Result Value Ref Range   Glucose-Capillary 113 (*) 70 - 99 mg/dL   Labs are reviewed and are pertinent for hypoglycemia  Current Facility-Administered Medications  Medication Dose Route Frequency Provider Last Rate Last Dose  . acetaminophen (TYLENOL) tablet  650 mg  650 mg Oral Q6H PRN Shanker Kristeen Mans, MD       Or  . acetaminophen (TYLENOL) suppository 650 mg  650 mg Rectal Q6H PRN Shanker Kristeen Mans, MD      . albuterol (PROVENTIL) (2.5 MG/3ML) 0.083% nebulizer solution 2.5 mg  2.5 mg Nebulization Q2H PRN Shanker Kristeen Mans, MD      . alum & mag hydroxide-simeth (MAALOX/MYLANTA) 200-200-20 MG/5ML suspension 30 mL  30 mL Oral Q6H PRN Jonetta Osgood, MD      . amLODipine (NORVASC) tablet 10 mg  10 mg Oral q morning - 10a Jonetta Osgood, MD   10 mg at 08/03/14 0946  . aspirin tablet 325 mg  325 mg Oral QPM Jonetta Osgood, MD   325 mg at 08/02/14 2335  . benazepril (LOTENSIN) tablet 20 mg  20 mg Oral q morning - 10a Shanker Kristeen Mans, MD   20  mg at 08/03/14 0946  . benztropine (COGENTIN) tablet 1 mg  1 mg Oral BID Jonetta Osgood, MD   1 mg at 08/03/14 0946  . dextrose (GLUTOSE) 40 % oral gel 37.5 g  1 Tube Oral PRN Jonetta Osgood, MD      . dextrose 5 %-0.9 % sodium chloride infusion   Intravenous Continuous Jonetta Osgood, MD 150 mL/hr at 08/03/14 1254 1,000 mL at 08/03/14 1254  . divalproex (DEPAKOTE ER) 24 hr tablet 500 mg  500 mg Oral BID Jonetta Osgood, MD   500 mg at 08/03/14 0946  . guaiFENesin-dextromethorphan (ROBITUSSIN DM) 100-10 MG/5ML syrup 5 mL  5 mL Oral Q4H PRN Jonetta Osgood, MD      . heparin injection 5,000 Units  5,000 Units Subcutaneous 3 times per day Jonetta Osgood, MD   5,000 Units at 08/03/14 1328  . hydrochlorothiazide (HYDRODIURIL) tablet 25 mg  25 mg Oral q morning - 10a Jonetta Osgood, MD   25 mg at 08/03/14 0946  . levothyroxine (SYNTHROID, LEVOTHROID) tablet 75 mcg  75 mcg Oral QAC breakfast Jonetta Osgood, MD   75 mcg at 08/03/14 0816  . LORazepam (ATIVAN) injection 2 mg  2 mg Intravenous Q6H PRN Jonetta Osgood, MD      . LORazepam (ATIVAN) tablet 1 mg  1 mg Oral TID Jonetta Osgood, MD   1 mg at 08/03/14 0946  . metoprolol (LOPRESSOR) tablet 50 mg  50 mg Oral BID Jonetta Osgood, MD    50 mg at 08/03/14 0946  . morphine 2 MG/ML injection 1 mg  1 mg Intravenous Q4H PRN Jonetta Osgood, MD      . multivitamin with minerals tablet 1 tablet  1 tablet Oral Daily Jonetta Osgood, MD   1 tablet at 08/03/14 0946  . ondansetron (ZOFRAN) tablet 4 mg  4 mg Oral Q6H PRN Shanker Kristeen Mans, MD       Or  . ondansetron (ZOFRAN) injection 4 mg  4 mg Intravenous Q6H PRN Shanker Kristeen Mans, MD      . oxyCODONE (Oxy IR/ROXICODONE) immediate release tablet 5 mg  5 mg Oral Q4H PRN Shanker Kristeen Mans, MD      . pantoprazole (PROTONIX) EC tablet 40 mg  40 mg Oral Daily Jonetta Osgood, MD   40 mg at 08/03/14 0946  . polyethylene glycol (MIRALAX / GLYCOLAX) packet 17 g  17 g Oral q morning - 10a Jonetta Osgood, MD   17 g at 08/03/14 0945  . potassium chloride (K-DUR) CR tablet 10 mEq  10 mEq Oral q morning - 10a Jonetta Osgood, MD   10 mEq at 08/03/14 0946  . QUEtiapine (SEROQUEL) tablet 200 mg  200 mg Oral BID Jonetta Osgood, MD   200 mg at 08/03/14 0946  . sertraline (ZOLOFT) tablet 150 mg  150 mg Oral q morning - 10a Jonetta Osgood, MD   150 mg at 08/03/14 0946  . tobramycin-dexamethasone (TOBRADEX) ophthalmic suspension 1 drop  1 drop Both Eyes QID Jonetta Osgood, MD   1 drop at 08/03/14 1328  . zolpidem (AMBIEN) tablet 10 mg  10 mg Oral QHS PRN Jonetta Osgood, MD        Psychiatric Specialty Exam:     Blood pressure 124/60, pulse 69, temperature 98.7 F (37.1 C), temperature source Oral, resp. rate 18, height 5' 6"  (1.676 m), weight 149 lb 3.2 oz (67.677 kg),  SpO2 100.00%.Body mass index is 24.09 kg/(m^2).  General Appearance: Fairly Groomed  Engineer, water::  Minimal  Speech:  Garbled and Slurred  Volume:  Decreased  Mood:  drowsy  Affect:  Inappropriate and Labile  Thought Process:  Disorganized and Loose  Orientation:  Other:  place only  Thought Content:  Delusions  Suicidal Thoughts:  No  Homicidal Thoughts:  No  Memory:  Immediate;   Poor Recent;    Poor Remote;   Poor  Judgement:  Impaired  Insight:  Lacking  Psychomotor Activity:  Decreased  Concentration:  Poor  Recall:  Poor  Fund of Knowledge:Poor  Language: Poor  Akathisia:  Negative  Handed:  Right  AIMS (if indicated):     Assets:  Housing Social Support  Sleep:      Musculoskeletal: Strength & Muscle Tone: within normal limits Gait & Station: normal Patient leans: N/A  Treatment Plan Summary: Daily contact with patient to assess and evaluate symptoms and progress in treatment Medication management Continue psychiatric medications as these have just been restarted. Recheck depakote level in a couple of days.  Jaramie Bastos, [facility] 08/03/2014 3:22 PM

## 2014-08-03 NOTE — Progress Notes (Signed)
Pt refused to wear CPAP at this time. He stated that he does not wear at home. Sitter at bedside. Pt understands to call RN if he changes his mind. RT will monitor.

## 2014-08-03 NOTE — Progress Notes (Signed)
Hypoglycemic Event  CBG: 56  Treatment: 15 GM carbohydrate snack  Symptoms: None  Follow-up CBG: Time:2223 CBG Result:70  Possible Reasons for Event: Unknown  Comments/MD notified: notified NP T. Claiborne Billingsallahan; pt with loss of IV access and not allowing nurses to restart IV at this time. Convinced pt to drink orange juice with sugar. Will try again to restart IV;    Kam Kushnir S  Remember to initiate Hypoglycemia Order Set & complete

## 2014-08-04 DIAGNOSIS — R4182 Altered mental status, unspecified: Secondary | ICD-10-CM

## 2014-08-04 LAB — BASIC METABOLIC PANEL
ANION GAP: 12 (ref 5–15)
BUN: 12 mg/dL (ref 6–23)
CHLORIDE: 102 meq/L (ref 96–112)
CO2: 26 mEq/L (ref 19–32)
Calcium: 8.9 mg/dL (ref 8.4–10.5)
Creatinine, Ser: 1.05 mg/dL (ref 0.50–1.35)
GFR calc Af Amer: >90 mL/min (ref >90)
GFR calc non Af Amer: 79 mL/min — ABNORMAL LOW (ref >90)
GLUCOSE: 78 mg/dL (ref 70–99)
POTASSIUM: 4.4 meq/L (ref 3.7–5.3)
SODIUM: 140 meq/L (ref 137–147)

## 2014-08-04 LAB — GLUCOSE, CAPILLARY
GLUCOSE-CAPILLARY: 95 mg/dL (ref 70–99)
GLUCOSE-CAPILLARY: 73 mg/dL (ref 70–99)
Glucose-Capillary: 114 mg/dL — ABNORMAL HIGH (ref 70–99)
Glucose-Capillary: 121 mg/dL — ABNORMAL HIGH (ref 70–99)
Glucose-Capillary: 127 mg/dL — ABNORMAL HIGH (ref 70–99)
Glucose-Capillary: 155 mg/dL — ABNORMAL HIGH (ref 70–99)
Glucose-Capillary: 96 mg/dL (ref 70–99)
Glucose-Capillary: 99 mg/dL (ref 70–99)

## 2014-08-04 LAB — CK: CK TOTAL: 393 U/L — AB (ref 7–232)

## 2014-08-04 NOTE — Clinical Social Work Note (Signed)
CSW consulted regarding inpatient psychiatric referral for patient. CSW reviewed chart and per psychiatry MD note, patient may be discharged back to assisted living facility when medically cleared. CSW to follow-up with assessment with patient. No further needs. CSW to continue to follow.  Kimmberly Wisser Patrick-Jefferson, LCSWA Weekend Clinical Social Worker [phone]

## 2014-08-04 NOTE — Progress Notes (Signed)
PATIENT DETAILS Name: [name] Age: 53 y.o. Sex: male Date of Birth: 1961-06-24 Admit Date: 08/02/2014 Admitting Physician Dewayne ShorterShanker Levora DredgeM Jordon Bourquin, MD PCP:Pcp Not In System  Subjective: Much improved mental state-able to give me Sister's telephone no this am. Answers all my questions appropriately.  Assessment/Plan: Principal Problem:   Hypoglycemia -seems to have resolved with D5NS-encourage oral intake-have asked RN to provide snacks inbetween meals, monitor another 24 hours off IVF. -A1C 5.9, sulfonylurea level pending  Active Problems: Altered Mental Status -suspect secondary to underlying psych issue. CT head neg, UA neg for UTI, Neck supple-no other foci of infection apparent.  -just restarted psych meds on admission-and now much better and back to baseline. Spoke with sister Ivor MessierCora over the phone 10/11-who had spoken with patient earlier-agrees that patient back to baseline. Per sister-she thinks patient may have used Alcohol-which could explain AMS on admission  [place] -appreciate Psych consult-recommendations are to continue with current medications, and for inpatient psych placement on discharge-will notify Social work-suspect will be ready medically by 10/12  Rhabdomyolysis -significantly better post hydration with IVF-continue to hold statin. Although CK elevated-no rigidity/fever to suggest Neuroleptic Malignant Synd  HTN (hypertension)  - Will continue prior antihypertensive medications-holding parameters ordered. Continue to follow BP and adjust medications accordingly   Hypothyroidism - Continue with levothyroxine  Obstructive sleep apnea  - CPAP when necessary -refusing per RT note  Disposition: Remain inpatient-Inpatient Psych on 10/12  DVT Prophylaxis: Prophylactic Heparin   Code Status: Full code   Family Communication None at bedside-spoke with sister on 10/11 over the phone  Procedures:  None  CONSULTS:  psychiatry  Time  spent 40 minutes-which includes 50% of the time with face-to-face with patient/ family and coordinating care related to the above assessment and plan.    MEDICATIONS: Scheduled Meds: . amLODipine  10 mg Oral q morning - 10a  . aspirin  325 mg Oral QPM  . benazepril  20 mg Oral q morning - 10a  . benztropine  1 mg Oral BID  . divalproex  500 mg Oral BID  . heparin  5,000 Units Subcutaneous 3 times per day  . hydrochlorothiazide  25 mg Oral q morning - 10a  . levothyroxine  75 mcg Oral QAC breakfast  . LORazepam  1 mg Oral TID  . metoprolol tartrate  50 mg Oral BID  . multivitamin with minerals  1 tablet Oral Daily  . pantoprazole  40 mg Oral Daily  . polyethylene glycol  17 g Oral q morning - 10a  . potassium chloride  10 mEq Oral q morning - 10a  . QUEtiapine  200 mg Oral BID  . sertraline  150 mg Oral q morning - 10a  . tobramycin-dexamethasone  1 drop Both Eyes QID   Continuous Infusions: . dextrose 5 % and 0.9% NaCl 1,000 mL (08/04/14 0749)   PRN Meds:.acetaminophen, acetaminophen, albuterol, alum & mag hydroxide-simeth, dextrose, guaiFENesin-dextromethorphan, LORazepam, morphine injection, ondansetron (ZOFRAN) IV, ondansetron, oxyCODONE, zolpidem  Antibiotics: Anti-infectives   None       PHYSICAL EXAM: Vital signs in last 24 hours: Filed Vitals:   08/03/14 0622 08/03/14 1528 08/03/14 2210 08/04/14 0554  BP: 124/60 111/66 145/80 150/78  Pulse: 69 67 70 65  Temp: 98.7 F (37.1 C) 98.1 F (36.7 C) 97.5 F (36.4 C) 98.3 F (36.8 C)  TempSrc: Oral Oral Oral Oral  Resp: 18 18 18 18   Height:      Weight:  SpO2: 100% 97% 99% 99%    Weight change:  Filed Weights   08/02/14 1756  Weight: 67.677 kg (149 lb 3.2 oz)   Body mass index is 24.09 kg/(m^2).   Gen Exam: Awake and, follows almost all commands today, answers  all questions appropriately.   Neck: Supple, No JVD.   Chest: B/L Clear.  No rales or rhonchi evident CVS: S1 S2 Regular, no murmurs.   Abdomen: soft, BS +, non tender, non distended.  Extremities: no edema, lower extremities warm to touch. Neurologic: Non Focal.   Skin: No Rash.   Wounds: N/A.    Intake/Output from previous day:  Intake/Output Summary (Last 24 hours) at 08/04/14 1119 Last data filed at 08/04/14 0943  Gross per 24 hour  Intake 3773.33 ml  Output   1150 ml  Net 2623.33 ml     LAB RESULTS: CBC  Recent Labs Lab 08/02/14 0634 08/03/14 0259  WBC 6.6 4.2  HGB 11.4* 10.8*  HCT 35.8* 33.7*  PLT 227 217  MCV 89.1 85.8  MCH 28.4 27.5  MCHC 31.8 32.0  RDW 15.3 14.9    Chemistries   Recent Labs Lab 08/02/14 0634 08/03/14 0259 08/04/14 0525  NA 140 137 140  K 3.8 4.1 4.4  CL 100 100 102  CO2 26 25 26   GLUCOSE 53* 103* 78  BUN 21 13 12   CREATININE 1.04 1.02 1.05  CALCIUM 9.1 9.2 8.9    CBG:  Recent Labs Lab 08/04/14 0007 08/04/14 0217 08/04/14 0428 08/04/14 0635 08/04/14 0909  GLUCAP 121* 127* 95 73 114*    GFR Estimated Creatinine Clearance: 73.4 ml/min (by C-G formula based on Cr of 1.05).  Coagulation profile No results found for this basename: INR, PROTIME,  in the last 168 hours  Cardiac Enzymes No results found for this basename: CK, CKMB, TROPONINI, MYOGLOBIN,  in the last 168 hours  No components found with this basename: POCBNP,  No results found for this basename: DDIMER,  in the last 72 hours  Recent Labs  08/03/14 0259  HGBA1C 5.9*   No results found for this basename: CHOL, HDL, LDLCALC, TRIG, CHOLHDL, LDLDIRECT,  in the last 72 hours  Recent Labs  08/03/14 0259  TSH 3.970   No results found for this basename: VITAMINB12, FOLATE, FERRITIN, TIBC, IRON, RETICCTPCT,  in the last 72 hours No results found for this basename: LIPASE, AMYLASE,  in the last 72 hours  Urine Studies No results found for this basename: UACOL, UAPR, USPG, UPH, UTP, UGL, UKET, UBIL, UHGB, UNIT, UROB, ULEU, UEPI, UWBC, URBC, UBAC, CAST, CRYS, UCOM, BILUA,  in the last 72  hours  MICROBIOLOGY: No results found for this or any previous visit (from the past 240 hour(s)).  RADIOLOGY STUDIES/RESULTS: Ct Head Wo Contrast  08/02/2014   CLINICAL DATA:  Altered mental status, disoriented; history of bipolar disorder and hypertension and thyroid disease  EXAM: CT HEAD WITHOUT CONTRAST  TECHNIQUE: Contiguous axial images were obtained from the base of the skull through the vertex without intravenous contrast.  COMPARISON:  Noncontrast CT scan of the brain of August 01, 2013  FINDINGS: The ventricles are normal in size and position. There is no intracranial hemorrhage nor intracranial mass effect. There is no acute ischemic change. The cerebellum and brainstem are unremarkable.  The observed paranasal sinuses and mastoid air cells are clear. There is no acute skull fracture.  IMPRESSION: There is no acute intracranial abnormality. There is no acute skull fracture.   Electronically  Signed   By: David  SwazilandJordan   On: 08/02/2014 17:17    Jeoffrey MassedGHIMIRE,Shatavia Santor, MD  [facility] Pager:[phone]  If 7PM-7AM, please contact night-coverage [email or web address] Password [email or web address] 08/04/2014, 11:19 AM   LOS: 2 days

## 2014-08-04 NOTE — Clinical Social Work Psychosocial (Signed)
Clinical Social Work Department BRIEF PSYCHOSOCIAL ASSESSMENT 08/04/2014  Patient:  [name],[name]     Account Number:  [id number]     Admit date:  08/02/2014  Clinical Social Worker:  Hubert Azure  Date/Time:  08/04/2014 06:24 PM  Referred by:  Physician  Date Referred:  08/04/2014 Referred for  Psychosocial assessment   Other Referral:   Interview type:  Patient Other interview type:    PSYCHOSOCIAL DATA Living Status:  FACILITY Admitted from facility:  [facility] Level of care:  Assisted Living Primary support name:  Vong Garringer [phone]) Primary support relationship to patient:  SIBLING Degree of support available:   Good    CURRENT CONCERNS Current Concerns  Post-Acute Placement  Other - See comment   Other Concerns:   Inpatient Psych    SOCIAL WORK ASSESSMENT / PLAN CSW met with patient who was alert and oriented. CSW introduced self and explained role. CSW discussed recent events with patient. Per patient, on the day of his admission, he began it by waiting for the admissions director of [facility] at the bus stop. Patient states he could see director in her vehicle talking with someone. Patient states he decided to board the bus and while on the bus became overwhelmed with amount of people on it, so he decided to get off at another stop. Patient reports once at the stop he began to walk up and down the street and became cold so he went into a local laundry mat to look for left clothes, possibly a jacket. Patient states he was given a pair of sneakers at the local [facility] clinic, but does not recall what happened to them. Patient reports he was not walking back and forth in a graveyard as the graveyard is locked up. Patient reports he was confused during the incident and recalls the ambulance picking him up.    Patient reports residing at [facility] since 2006, but desires assistance with choosing another ALF. Per patient, the volume of residents  has increased and now the get small amounts of food, "like 2-3 spoon fulls." Patient lists sister Fernand Sorbello as his primary support system. Per patient, they talk on the phone every other day. Patient also admits to not taking his medication as he reports, he has to pay a co-pay and only gets $66 a month and once the co-payment is made for his medication, he is left with nothing. CSW discussed with patient the dangers of not taking his medicine and the benefits of taking his medication. Patient verbalized understanding.   Assessment/plan status:  Other - See comment Other assessment/ plan:   CSW to update [facility] and [facility] for placement.   Information/referral to community resources:    PATIENT'S/FAMILY'S RESPONSE TO PLAN OF CARE: Patient was cooperative although somewhat confused when recalling events.   [place], [place] Weekend Clinical Social Worker [phone]

## 2014-08-04 NOTE — Progress Notes (Signed)
Utilization review completed.  

## 2014-08-04 NOTE — Consult Note (Signed)
[facility] Consult   Reason for Consult:  Altered mental status, confusion Referring Physician:  Dr. Olga Millers [name] is an 53 y.o. male. Total Time spent with patient: 45 minutes  Assessment: AXIS I:  Schizophrenia AXIS II:  Deferred AXIS III:   Past Medical History  Diagnosis Date  . Bipolar 1 disorder   . Hypertension   . Thyroid disease   . GERD (gastroesophageal reflux disease)   . Obstructive sleep apnea   . COPD (chronic obstructive pulmonary disease)    AXIS IV:  other psychosocial or environmental problems AXIS V:  21-30 behavior considerably influenced by delusions or hallucinations OR serious impairment in judgment, communication OR inability to function in almost all areas  Plan:  Patient is stabilizing and may be discharged back to assisted living facility when [facility] cleared  Subjective:   [name] is a 53 y.o. male patient admitted with altered mental status.  HPI: Patient is 53 year old black male with history of schizophrenia or schizoaffective disorder. He was [place] wandering [place]. Apparently he left his assisted living facility and has not been compliant with medications. He is unable to answer question, is drowsy with slurred and garbled speech. He is oriented only to place. He has a history of neuroleptic malignant syndrome but does not have fever or stiffness. His home meds have been restarted   Patient seen today, 08/04/14. His speech is still a little slurred but much clearer. He is pleasant ,cooperative, answers questions appropriately, totally oriented. He denies auditory or visual hallucinations or suicidal ideation.   HPI Elements:   Location:  generalized. Quality:  severe. Severity:  severe. Timing:  acute. Duration:  years. Context:  off medications.  Past Psychiatric History: Past Medical History  Diagnosis Date  . Bipolar 1 disorder   . Hypertension   . Thyroid disease   . GERD (gastroesophageal  reflux disease)   . Obstructive sleep apnea   . COPD (chronic obstructive pulmonary disease)     reports that he has been smoking Cigarettes.  He has been smoking about 0.50 packs per day. He does not have any smokeless tobacco history on file. He reports that he drinks alcohol. He reports that he does not use illicit drugs. Family History  Problem Relation Age of Onset  . Cirrhosis Mother   . Hypertension Mother   . Diabetes Mellitus II Mother    Family History Substance Abuse:  (UTA) Family Supports:  (UTA) Living Arrangements: Other (Comment) (Unknown) Can pt return to current living arrangement?:  (Unknown) Abuse/Neglect [facility]) Physical Abuse:  (UTA) Verbal Abuse:  (UTA) Sexual Abuse:  (UTA) Allergies:  No Known Allergies   Objective: Blood pressure 150/78, pulse 65, temperature 98.3 F (36.8 C), temperature source Oral, resp. rate 18, height _0  (1.676 m), weight 149 lb 3.2 oz (67.677 kg), SpO2 99.00%.Body mass index is 24.09 kg/(m^2). Results for orders placed during the hospital encounter of 08/02/14 (from the past 72 hour(s))  URINE RAPID DRUG SCREEN (HOSP PERFORMED)     Status: None   Collection Time    08/02/14  6:25 AM      Result Value Ref Range   Opiates NONE DETECTED  NONE DETECTED   Cocaine NONE DETECTED  NONE DETECTED   Benzodiazepines NONE DETECTED  NONE DETECTED   Amphetamines NONE DETECTED  NONE DETECTED   Tetrahydrocannabinol NONE DETECTED  NONE DETECTED   Barbiturates NONE DETECTED  NONE DETECTED   Comment:  DRUG SCREEN FOR MEDICAL PURPOSES     ONLY.  IF CONFIRMATION IS NEEDED     FOR ANY PURPOSE, NOTIFY LAB     WITHIN 5 DAYS.                LOWEST DETECTABLE LIMITS     FOR URINE DRUG SCREEN     Drug Class       Cutoff (ng/mL)     Amphetamine      1000     Barbiturate      200     Benzodiazepine   017     Tricyclics       793     Opiates          300     Cocaine          300     THC              50  ACETAMINOPHEN LEVEL     Status:  None   Collection Time    08/02/14  6:34 AM      Result Value Ref Range   Acetaminophen (Tylenol), Serum <15.0  10 - 30 ug/mL   Comment:            THERAPEUTIC CONCENTRATIONS VARY     SIGNIFICANTLY. A RANGE OF 10-30     ug/mL MAY BE AN EFFECTIVE     CONCENTRATION FOR MANY PATIENTS.     HOWEVER, SOME ARE BEST TREATED     AT CONCENTRATIONS OUTSIDE THIS     RANGE.     ACETAMINOPHEN CONCENTRATIONS     >150 ug/mL AT 4 HOURS AFTER     INGESTION AND >50 ug/mL AT 12     HOURS AFTER INGESTION ARE     OFTEN ASSOCIATED WITH TOXIC     REACTIONS.  CBC     Status: Abnormal   Collection Time    08/02/14  6:34 AM      Result Value Ref Range   WBC 6.6  4.0 - 10.5 K/uL   RBC 4.02 (*) 4.22 - 5.81 MIL/uL   Hemoglobin 11.4 (*) 13.0 - 17.0 g/dL   HCT 35.8 (*) 39.0 - 52.0 %   MCV 89.1  78.0 - 100.0 fL   MCH 28.4  26.0 - 34.0 pg   MCHC 31.8  30.0 - 36.0 g/dL   RDW 15.3  11.5 - 15.5 %   Platelets 227  150 - 400 K/uL  COMPREHENSIVE METABOLIC PANEL     Status: Abnormal   Collection Time    08/02/14  6:34 AM      Result Value Ref Range   Sodium 140  137 - 147 mEq/L   Potassium 3.8  3.7 - 5.3 mEq/L   Chloride 100  96 - 112 mEq/L   CO2 26  19 - 32 mEq/L   Glucose, Bld 53 (*) 70 - 99 mg/dL   BUN 21  6 - 23 mg/dL   Creatinine, Ser 1.04  0.50 - 1.35 mg/dL   Calcium 9.1  8.4 - 10.5 mg/dL   Total Protein 7.6  6.0 - 8.3 g/dL   Albumin 4.0  3.5 - 5.2 g/dL   AST 62 (*) 0 - 37 U/L   ALT 29  0 - 53 U/L   Alkaline Phosphatase 74  39 - 117 U/L   Total Bilirubin 0.2 (*) 0.3 - 1.2 mg/dL   GFR calc non Af Amer 80 (*) >90 mL/min   GFR calc  Af Amer >90  >90 mL/min   Comment: (NOTE)     The eGFR has been calculated using the CKD EPI equation.     This calculation has not been validated in all clinical situations.     eGFR's persistently <90 mL/min signify possible Chronic Kidney     Disease.   Anion gap 14  5 - 15  ETHANOL     Status: None   Collection Time    08/02/14  6:34 AM      Result Value Ref  Range   Alcohol, Ethyl (B) <11  0 - 11 mg/dL   Comment:            LOWEST DETECTABLE LIMIT FOR     SERUM ALCOHOL IS 11 mg/dL     FOR MEDICAL PURPOSES ONLY  SALICYLATE LEVEL     Status: Abnormal   Collection Time    08/02/14  6:34 AM      Result Value Ref Range   Salicylate Lvl <4.3 (*) 2.8 - 20.0 mg/dL  CBG MONITORING, ED     Status: None   Collection Time    08/02/14  8:48 AM      Result Value Ref Range   Glucose-Capillary 74  70 - 99 mg/dL  CBG MONITORING, ED     Status: Abnormal   Collection Time    08/02/14  9:59 AM      Result Value Ref Range   Glucose-Capillary 53 (*) 70 - 99 mg/dL  VALPROIC ACID LEVEL     Status: Abnormal   Collection Time    08/02/14 11:28 AM      Result Value Ref Range   Valproic Acid Lvl 22.8 (*) 50.0 - 100.0 ug/mL  CBG MONITORING, ED     Status: None   Collection Time    08/02/14 12:02 PM      Result Value Ref Range   Glucose-Capillary 86  70 - 99 mg/dL  CBG MONITORING, ED     Status: Abnormal   Collection Time    08/02/14 12:37 PM      Result Value Ref Range   Glucose-Capillary 67 (*) 70 - 99 mg/dL  CBG MONITORING, ED     Status: Abnormal   Collection Time    08/02/14  1:28 PM      Result Value Ref Range   Glucose-Capillary 49 (*) 70 - 99 mg/dL  CBG MONITORING, ED     Status: Abnormal   Collection Time    08/02/14  2:29 PM      Result Value Ref Range   Glucose-Capillary 102 (*) 70 - 99 mg/dL  CBG MONITORING, ED     Status: None   Collection Time    08/02/14  3:16 PM      Result Value Ref Range   Glucose-Capillary 85  70 - 99 mg/dL  CBG MONITORING, ED     Status: Abnormal   Collection Time    08/02/14  3:51 PM      Result Value Ref Range   Glucose-Capillary 61 (*) 70 - 99 mg/dL  CK     Status: Abnormal   Collection Time    08/02/14  4:29 PM      Result Value Ref Range   Total CK 1531 (*) 7 - 232 U/L  CBG MONITORING, ED     Status: None   Collection Time    08/02/14  5:20 PM      Result Value Ref Range   Glucose-Capillary 72  70 - 99 mg/dL  GLUCOSE, CAPILLARY     Status: None   Collection Time    08/02/14  7:56 PM      Result Value Ref Range   Glucose-Capillary 73  70 - 99 mg/dL  GLUCOSE, CAPILLARY     Status: Abnormal   Collection Time    08/02/14  9:57 PM      Result Value Ref Range   Glucose-Capillary 56 (*) 70 - 99 mg/dL  GLUCOSE, CAPILLARY     Status: None   Collection Time    08/02/14 10:23 PM      Result Value Ref Range   Glucose-Capillary 70  70 - 99 mg/dL  GLUCOSE, CAPILLARY     Status: Abnormal   Collection Time    08/02/14 11:58 PM      Result Value Ref Range   Glucose-Capillary 118 (*) 70 - 99 mg/dL   Comment 1 Notify RN     Comment 2 Documented in Chart    GLUCOSE, CAPILLARY     Status: Abnormal   Collection Time    08/03/14  2:03 AM      Result Value Ref Range   Glucose-Capillary 108 (*) 70 - 99 mg/dL   Comment 1 Notify RN     Comment 2 Documented in Chart    TSH     Status: None   Collection Time    08/03/14  2:59 AM      Result Value Ref Range   TSH 3.970  0.350 - 4.500 uIU/mL  HEMOGLOBIN A1C     Status: Abnormal   Collection Time    08/03/14  2:59 AM      Result Value Ref Range   Hemoglobin A1C 5.9 (*) <5.7 %   Comment: (NOTE)                                                                               According to the ADA Clinical Practice Recommendations for 2011, when     HbA1c is used as a screening test:      >=6.5%   Diagnostic of Diabetes Mellitus               (if abnormal result is confirmed)     5.7-6.4%   Increased risk of developing Diabetes Mellitus     References:Diagnosis and Classification of Diabetes Mellitus,Diabetes     GOTL,5726,20(BTDHR 1):S62-S69 and Standards of Medical Care in             Diabetes - 2011,Diabetes Care,2011,34 (Suppl 1):S11-S61.   Mean Plasma Glucose 123 (*) <117 mg/dL   Comment: Performed at [place]     Status: Abnormal   Collection Time    08/03/14  2:59 AM      Result Value Ref Range    Sodium 137  137 - 147 mEq/L   Potassium 4.1  3.7 - 5.3 mEq/L   Chloride 100  96 - 112 mEq/L   CO2 25  19 - 32 mEq/L   Glucose, Bld 103 (*) 70 - 99 mg/dL   BUN 13  6 - 23 mg/dL   Creatinine, Ser 1.02  0.50 - 1.35 mg/dL  Calcium 9.2  8.4 - 10.5 mg/dL   GFR calc non Af Amer 82 (*) >90 mL/min   GFR calc Af Amer >90  >90 mL/min   Comment: (NOTE)     The eGFR has been calculated using the CKD EPI equation.     This calculation has not been validated in all clinical situations.     eGFR's persistently <90 mL/min signify possible Chronic Kidney     Disease.   Anion gap 12  5 - 15  CBC     Status: Abnormal   Collection Time    08/03/14  2:59 AM      Result Value Ref Range   WBC 4.2  4.0 - 10.5 K/uL   RBC 3.93 (*) 4.22 - 5.81 MIL/uL   Hemoglobin 10.8 (*) 13.0 - 17.0 g/dL   HCT 33.7 (*) 39.0 - 52.0 %   MCV 85.8  78.0 - 100.0 fL   MCH 27.5  26.0 - 34.0 pg   MCHC 32.0  30.0 - 36.0 g/dL   RDW 14.9  11.5 - 15.5 %   Platelets 217  150 - 400 K/uL  CK     Status: Abnormal   Collection Time    08/03/14  2:59 AM      Result Value Ref Range   Total CK 1064 (*) 7 - 232 U/L  CORTISOL     Status: None   Collection Time    08/03/14  2:59 AM      Result Value Ref Range   Cortisol, Plasma 9.3     Comment: (NOTE)     AM:  4.3 - 22.4 ug/dL     PM:  3.1 - 16.7 ug/dL     Performed at [place], CAP[facility]     Status: None   Collection Time    08/03/14  3:43 AM      Result Value Ref Range   Glucose-Capillary 90  70 - 99 mg/dL  GLUCOSE, CAPILLARY     Status: Abnormal   Collection Time    08/03/14  6:17 AM      Result Value Ref Range   Glucose-Capillary 68 (*) 70 - 99 mg/dL  GLUCOSE, CAPILLARY     Status: Abnormal   Collection Time    08/03/14  6:43 AM      Result Value Ref Range   Glucose-Capillary 104 (*) 70 - 99 mg/dL  GLUCOSE, CAPILLARY     Status: None   Collection Time    08/03/14  8:25 AM      Result Value Ref Range   Glucose-Capillary 78  70 - 99 mg/dL   GLUCOSE, CAPILLARY     Status: None   Collection Time    08/03/14 10:30 AM      Result Value Ref Range   Glucose-Capillary 93  70 - 99 mg/dL  GLUCOSE, CAPILLARY     Status: Abnormal   Collection Time    08/03/14 12:22 PM      Result Value Ref Range   Glucose-Capillary 113 (*) 70 - 99 mg/dL  GLUCOSE, CAPILLARY     Status: None   Collection Time    08/03/14  3:27 PM      Result Value Ref Range   Glucose-Capillary 99  70 - 99 mg/dL  GLUCOSE, CAPILLARY     Status: Abnormal   Collection Time    08/03/14  6:56 PM      Result Value Ref Range   Glucose-Capillary 119 (*)  70 - 99 mg/dL  GLUCOSE, CAPILLARY     Status: Abnormal   Collection Time    08/03/14  8:07 PM      Result Value Ref Range   Glucose-Capillary 109 (*) 70 - 99 mg/dL   Comment 1 Documented in Chart     Comment 2 Notify RN    GLUCOSE, CAPILLARY     Status: Abnormal   Collection Time    08/03/14 10:08 PM      Result Value Ref Range   Glucose-Capillary 114 (*) 70 - 99 mg/dL  GLUCOSE, CAPILLARY     Status: Abnormal   Collection Time    08/04/14 12:07 AM      Result Value Ref Range   Glucose-Capillary 121 (*) 70 - 99 mg/dL   Comment 1 Documented in Chart     Comment 2 Notify RN    GLUCOSE, CAPILLARY     Status: Abnormal   Collection Time    08/04/14  2:17 AM      Result Value Ref Range   Glucose-Capillary 127 (*) 70 - 99 mg/dL  GLUCOSE, CAPILLARY     Status: None   Collection Time    08/04/14  4:28 AM      Result Value Ref Range   Glucose-Capillary 95  70 - 99 mg/dL   Comment 1 Documented in Chart     Comment 2 Notify RN    BASIC METABOLIC PANEL     Status: Abnormal   Collection Time    08/04/14  5:25 AM      Result Value Ref Range   Sodium 140  137 - 147 mEq/L   Potassium 4.4  3.7 - 5.3 mEq/L   Chloride 102  96 - 112 mEq/L   CO2 26  19 - 32 mEq/L   Glucose, Bld 78  70 - 99 mg/dL   BUN 12  6 - 23 mg/dL   Creatinine, Ser 1.05  0.50 - 1.35 mg/dL   Calcium 8.9  8.4 - 10.5 mg/dL   GFR calc non Af Amer 79  (*) >90 mL/min   GFR calc Af Amer >90  >90 mL/min   Comment: (NOTE)     The eGFR has been calculated using the CKD EPI equation.     This calculation has not been validated in all clinical situations.     eGFR's persistently <90 mL/min signify possible Chronic Kidney     Disease.   Anion gap 12  5 - 15  CK     Status: Abnormal   Collection Time    08/04/14  5:25 AM      Result Value Ref Range   Total CK 393 (*) 7 - 232 U/L  GLUCOSE, CAPILLARY     Status: None   Collection Time    08/04/14  6:35 AM      Result Value Ref Range   Glucose-Capillary 73  70 - 99 mg/dL   Comment 1 Notify RN     Comment 2 Documented in Chart    GLUCOSE, CAPILLARY     Status: Abnormal   Collection Time    08/04/14  9:09 AM      Result Value Ref Range   Glucose-Capillary 114 (*) 70 - 99 mg/dL  GLUCOSE, CAPILLARY     Status: None   Collection Time    08/04/14 11:58 AM      Result Value Ref Range   Glucose-Capillary 99  70 - 99 mg/dL   Labs are reviewed  and are pertinent for hypoglycemia  Current Facility-Administered Medications  Medication Dose Route Frequency Provider Last Rate Last Dose  . acetaminophen (TYLENOL) tablet 650 mg  650 mg Oral Q6H PRN Shanker Kristeen Mans, MD       Or  . acetaminophen (TYLENOL) suppository 650 mg  650 mg Rectal Q6H PRN Shanker Kristeen Mans, MD      . albuterol (PROVENTIL) (2.5 MG/3ML) 0.083% nebulizer solution 2.5 mg  2.5 mg Nebulization Q2H PRN Shanker Kristeen Mans, MD      . alum & mag hydroxide-simeth (MAALOX/MYLANTA) 200-200-20 MG/5ML suspension 30 mL  30 mL Oral Q6H PRN Jonetta Osgood, MD      . amLODipine (NORVASC) tablet 10 mg  10 mg Oral q morning - 10a Jonetta Osgood, MD   10 mg at 08/04/14 0921  . aspirin tablet 325 mg  325 mg Oral QPM Jonetta Osgood, MD   325 mg at 08/03/14 1737  . benazepril (LOTENSIN) tablet 20 mg  20 mg Oral q morning - 10a Jonetta Osgood, MD   20 mg at 08/04/14 0921  . benztropine (COGENTIN) tablet 1 mg  1 mg Oral BID Jonetta Osgood, MD   1 mg at 08/04/14 7096  . dextrose (GLUTOSE) 40 % oral gel 37.5 g  1 Tube Oral PRN Jonetta Osgood, MD      . dextrose 5 %-0.9 % sodium chloride infusion   Intravenous Continuous Jonetta Osgood, MD 50 mL/hr at 08/04/14 0749 1,000 mL at 08/04/14 0749  . divalproex (DEPAKOTE ER) 24 hr tablet 500 mg  500 mg Oral BID Jonetta Osgood, MD   500 mg at 08/04/14 0921  . guaiFENesin-dextromethorphan (ROBITUSSIN DM) 100-10 MG/5ML syrup 5 mL  5 mL Oral Q4H PRN Jonetta Osgood, MD      . heparin injection 5,000 Units  5,000 Units Subcutaneous 3 times per day Jonetta Osgood, MD   5,000 Units at 08/04/14 1323  . hydrochlorothiazide (HYDRODIURIL) tablet 25 mg  25 mg Oral q morning - 10a Jonetta Osgood, MD   25 mg at 08/04/14 0921  . levothyroxine (SYNTHROID, LEVOTHROID) tablet 75 mcg  75 mcg Oral QAC breakfast Jonetta Osgood, MD   75 mcg at 08/04/14 0724  . LORazepam (ATIVAN) injection 2 mg  2 mg Intravenous Q6H PRN Jonetta Osgood, MD      . LORazepam (ATIVAN) tablet 1 mg  1 mg Oral TID Jonetta Osgood, MD   1 mg at 08/04/14 0921  . metoprolol (LOPRESSOR) tablet 50 mg  50 mg Oral BID Jonetta Osgood, MD   50 mg at 08/04/14 2836  . morphine 2 MG/ML injection 1 mg  1 mg Intravenous Q4H PRN Jonetta Osgood, MD      . multivitamin with minerals tablet 1 tablet  1 tablet Oral Daily Jonetta Osgood, MD   1 tablet at 08/04/14 6294  . ondansetron (ZOFRAN) tablet 4 mg  4 mg Oral Q6H PRN Shanker Kristeen Mans, MD       Or  . ondansetron (ZOFRAN) injection 4 mg  4 mg Intravenous Q6H PRN Shanker Kristeen Mans, MD      . oxyCODONE (Oxy IR/ROXICODONE) immediate release tablet 5 mg  5 mg Oral Q4H PRN Shanker Kristeen Mans, MD      . pantoprazole (PROTONIX) EC tablet 40 mg  40 mg Oral Daily Jonetta Osgood, MD   40 mg at 08/04/14 0921  . polyethylene glycol (MIRALAX /  GLYCOLAX) packet 17 g  17 g Oral q morning - 10a Jonetta Osgood, MD   17 g at 08/04/14 0920  . potassium chloride (K-DUR) CR  tablet 10 mEq  10 mEq Oral q morning - 10a Jonetta Osgood, MD   10 mEq at 08/04/14 0921  . QUEtiapine (SEROQUEL) tablet 200 mg  200 mg Oral BID Jonetta Osgood, MD   200 mg at 08/04/14 0921  . sertraline (ZOLOFT) tablet 150 mg  150 mg Oral q morning - 10a Jonetta Osgood, MD   150 mg at 08/04/14 4353  . tobramycin-dexamethasone (TOBRADEX) ophthalmic suspension 1 drop  1 drop Both Eyes QID Jonetta Osgood, MD   1 drop at 08/04/14 1323  . zolpidem (AMBIEN) tablet 10 mg  10 mg Oral QHS PRN Jonetta Osgood, MD        Psychiatric Specialty Exam:     Blood pressure 150/78, pulse 65, temperature 98.3 F (36.8 C), temperature source Oral, resp. rate 18, height _0  (1.676 m), weight 149 lb 3.2 oz (67.677 kg), SpO2 99.00%.Body mass index is 24.09 kg/(m^2).  General Appearance: Fairly Groomed  Eye Contact::good  Speech: much clearer  Volume:  normal  Mood:  bright  Affect:  appropriate    Orientation:  Person, place, day  Thought Content:  wnls  Suicidal Thoughts:  No  Homicidal Thoughts:  No  Memory:  Immediate;   Poor Recent;   Poor Remote;   Poor  Judgement:  Impaired  Insight:  Lacking  Psychomotor Activity:  Decreased  Concentration:  Poor  Recall:  Poor  Fund of Knowledge:Poor  Language: Poor  Akathisia:  Negative  Handed:  Right  AIMS (if indicated):     Assets:  Housing Social Support  Sleep:      Musculoskeletal: Strength & Muscle Tone: within normal limits Gait & Station: normal Patient leans: N/A  Treatment Plan Summary: Daily contact with patient to assess and evaluate symptoms and progress in treatment Medication management Continue psychiatric medications  Recheck depakote level in a couple of days. If he contines to be stable, he may be discharged back to the assisted living facility with outpatient  follow up  [place], [facility] 08/04/2014 2:59 PM

## 2014-08-05 LAB — GLUCOSE, CAPILLARY
GLUCOSE-CAPILLARY: 74 mg/dL (ref 70–99)
Glucose-Capillary: 105 mg/dL — ABNORMAL HIGH (ref 70–99)
Glucose-Capillary: 93 mg/dL (ref 70–99)
Glucose-Capillary: 95 mg/dL (ref 70–99)

## 2014-08-05 LAB — INSULIN, RANDOM: Insulin: 6.5 u[IU]/mL (ref 3–28)

## 2014-08-05 MED ORDER — LORAZEPAM 2 MG PO TABS: 1.0000 mg | ORAL_TABLET | Freq: Three times a day (TID) | ORAL | Status: DC

## 2014-08-05 MED ORDER — ACETAMINOPHEN 325 MG PO TABS: 650.0000 mg | ORAL_TABLET | Freq: Four times a day (QID) | ORAL | Status: DC | PRN

## 2014-08-05 MED ORDER — ZOLPIDEM TARTRATE 10 MG PO TABS: 10.0000 mg | ORAL_TABLET | Freq: Every day | ORAL | Status: DC

## 2014-08-05 MED ORDER — PANTOPRAZOLE SODIUM 40 MG PO TBEC: 40.0000 mg | DELAYED_RELEASE_TABLET | Freq: Every day | ORAL | Status: DC

## 2014-08-05 MED ORDER — LORAZEPAM 2 MG PO TABS: 1.0000 mg | ORAL_TABLET | ORAL | Status: DC | PRN

## 2014-08-05 NOTE — Discharge Summary (Addendum)
Physician Discharge Summary  [name] WUJ:[id number] DOB: 08-06-1961 DOA: 08/02/2014  PCP: Pcp Not In System Dr. Vickii ChafeSadou Eberham.  Admit date: 08/02/2014 Discharge date: 08/05/2014  Time spent: 45 minutes  Recommendations for Outpatient Follow-up:   1. PCP follow up to check cbc, ck and cmet in 1 week. 2. Patient to be [facility] to [facility]. 3. Proinsulin / insulin ratio and Sulfonylurea level pending - please follow final results. 4. Psychiatry recommends outpatient psychiatric follow up. 5. Please provide snacks in between meals- please check CBGs 3-4 times a day till seen by PCP  Discharge Diagnoses:  Principal Problem:   Hypoglycemia Active Problems:   HTN (hypertension)   Bipolar 1 disorder   Obstructive sleep apnea   Rhabdomyolysis   Psychoses   Dyslipidemia   Discharge Condition: stable.  Diet recommendation: Regular.  Recommend snacks between meals as needed to maintain appropriate cbg.  Filed Weights   08/02/14 1756  Weight: 67.677 kg (149 lb 3.2 oz)    History of present illness:  [name] is a 53 y.o. male with a Past Medical History of bipolar disorder, COPD, obstructive sleep apnea, hypertension, dyslipidemia.  [place]00 am today by [facility], was brought to the emergency room for further evaluation. He was not answering any questions appropriately, he was not able to tell any of the ED staff where he was living and with whom he was living. He was observed in the emergency room, and was noted to be persistently hypoglycemic.   Hospital Course:   Hypoglycemia  -Uncertain etiology. -Resolved with D5NS-encourage oral intake- provided snacks in between meals -A1C 5.9, sulfonylurea level pending at time of discharge. -Please check CBGs 3-4 times a day till seen by PCP  Altered Mental Status  -suspect secondary to underlying psych issue. CT head neg, UA neg for UTI, Neck supple-no other foci of  infection apparent.  -restarted psych meds on admission-and now much better and back to baseline. Spoke with sister [name] over the phone 10/11-who had spoken with patient earlier-agrees that patient back to baseline. Per sister-she thinks patient may have used Alcohol-which could explain AMS on admission   Schozphrenia  -appreciate Psych consult-recommendations are to continue with current medications. - Psych assessment from 10/11 recommends discharge to ALF with outpatient psychiatry follow up.  Rhabdomyolysis  -significantly better post hydration with IVF-statin was held on admission. -Although CK elevated-no rigidity/fever to suggest Neuroleptic Malignant Synd   HTN (hypertension)  - Will continue prior antihypertensive medications- - Continue to follow BP and adjust medications accordingly   Hypothyroidism  - Continue with levothyroxine   Obstructive sleep apnea  - CPAP when necessary - patient refused in the hospital.   Consultations:  Psychiatry.  Discharge Exam: Filed Vitals:   08/05/14 0600  BP: 129/84  Pulse: 83  Temp: 98.4 F (36.9 C)  Resp: 16    General: Awake alert, cooperative.  Pleasant but confused. Sitting up in bed.  Appears well. Cardiovascular: rrr no m/r/g Respiratory: cta no w/c/r Abdomen: soft, nt, nd, +bs, no masses Extremities:  No swelling/Cyanosis/Edema.  Able to ambulate without difficulty.   Discharge Instructions   Discharge Instructions   Diet - low sodium heart healthy    Complete by:  As directed      Increase activity slowly    Complete by:  As directed           Current Discharge Medication List    START taking these  medications   Details  acetaminophen (TYLENOL) 325 MG tablet Take 2 tablets (650 mg total) by mouth every 6 (six) hours as needed for mild pain (or Fever >/= 101).    pantoprazole (PROTONIX) 40 MG tablet Take 1 tablet (40 mg total) by mouth daily. Qty: 30 tablet, Refills: 0      CONTINUE these medications  which have CHANGED   Details  !! LORazepam (ATIVAN) 2 MG tablet Take 0.5 tablets (1 mg total) by mouth 3 (three) times daily. Qty: 30 tablet, Refills: 0    !! LORazepam (ATIVAN) 2 MG tablet Take 0.5 tablets (1 mg total) by mouth every 4 (four) hours as needed (agitation). Maximum 2 prn doses in 24 hours Qty: 30 tablet, Refills: 0     !! - Potential duplicate medications found. Please discuss with provider.    CONTINUE these medications which have NOT CHANGED   Details  amLODipine (NORVASC) 10 MG tablet Take 10 mg by mouth daily.     aspirin 325 MG tablet Take 325 mg by mouth every evening. Take 30 min prior to Niaspan.    benazepril (LOTENSIN) 20 MG tablet Take 20 mg by mouth daily.     benztropine (COGENTIN) 1 MG tablet Take 1 mg by mouth 2 (two) times daily.     cholecalciferol (VITAMIN D) 1000 UNITS tablet Take 1,000 Units by mouth daily.     divalproex (DEPAKOTE ER) 500 MG 24 hr tablet Take 500 mg by mouth 2 (two) times daily.    docusate sodium (COLACE) 100 MG capsule Take 200 mg by mouth 2 (two) times daily.    hydrochlorothiazide (HYDRODIURIL) 25 MG tablet Take 25 mg by mouth daily.     levothyroxine (SYNTHROID, LEVOTHROID) 75 MCG tablet Take 75 mcg by mouth daily before breakfast.     metoprolol (LOPRESSOR) 50 MG tablet Take 50 mg by mouth 2 (two) times daily.    multivitamin-iron-minerals-folic acid (THERAPEUTIC-M) TABS tablet Take 1 tablet by mouth daily.     niacin (NIASPAN) 750 MG CR tablet Take 750 mg by mouth at bedtime.     polyethylene glycol (MIRALAX / GLYCOLAX) packet Take 17 g by mouth daily.     potassium chloride (K-DUR) 10 MEQ tablet Take 10 mEq by mouth daily.     promethazine (PHENERGAN) 25 MG tablet Take 25 mg by mouth every 6 (six) hours as needed for nausea or vomiting.    QUEtiapine (SEROQUEL) 200 MG tablet Take 200 mg by mouth 2 (two) times daily.    sertraline (ZOLOFT) 100 MG tablet Take 150 mg by mouth daily.     simvastatin (ZOCOR) 40 MG  tablet Take 40 mg by mouth every evening.    tobramycin-dexamethasone (TOBRADEX) ophthalmic solution Place 1 drop into both eyes 4 (four) times daily.     zolpidem (AMBIEN) 10 MG tablet Take 10 mg by mouth at bedtime.     ferrous sulfate 325 (65 FE) MG tablet Take 325 mg by mouth 2 (two) times daily.      STOP taking these medications     ranitidine (ZANTAC) 150 MG tablet      omeprazole (PRILOSEC) 20 MG capsule        No Known Allergies    The results of significant diagnostics from this hospitalization (including imaging, microbiology, ancillary and laboratory) are listed below for reference.    Significant Diagnostic Studies: Ct Head Wo Contrast  08/02/2014   CLINICAL DATA:  Altered mental status, disoriented; history of bipolar disorder and hypertension  and thyroid disease  EXAM: CT HEAD WITHOUT CONTRAST  TECHNIQUE: Contiguous axial images were obtained from the base of the skull through the vertex without intravenous contrast.  COMPARISON:  Noncontrast CT scan of the brain of August 01, 2013  FINDINGS: The ventricles are normal in size and position. There is no intracranial hemorrhage nor intracranial mass effect. There is no acute ischemic change. The cerebellum and brainstem are unremarkable.  The observed paranasal sinuses and mastoid air cells are clear. There is no acute skull fracture.  IMPRESSION: There is no acute intracranial abnormality. There is no acute skull fracture.   Electronically Signed   By: David  [place]   On: 08/02/2014 17:17      Labs: Basic Metabolic Panel:  Recent Labs Lab 08/02/14 0634 08/03/14 0259 08/04/14 0525  NA 140 137 140  K 3.8 4.1 4.4  CL 100 100 102  CO2 26 25 26   GLUCOSE 53* 103* 78  BUN 21 13 12   CREATININE 1.04 1.02 1.05  CALCIUM 9.1 9.2 8.9   Liver Function Tests:  Recent Labs Lab 08/02/14 0634  AST 62*  ALT 29  ALKPHOS 74  BILITOT 0.2*  PROT 7.6  ALBUMIN 4.0   CBC:  Recent Labs Lab 08/02/14 0634 08/03/14 0259   WBC 6.6 4.2  HGB 11.4* 10.8*  HCT 35.8* 33.7*  MCV 89.1 85.8  PLT 227 217   Cardiac Enzymes:  Recent Labs Lab 08/02/14 1629 08/03/14 0259 08/04/14 0525  CKTOTAL 1531* 1064* 393*   CBG:  Recent Labs Lab 08/04/14 1639 08/04/14 2055 08/05/14 0006 08/05/14 0419 08/05/14 0753  GLUCAP 96 155* 95 74 105*       Signed:  Stephani Police, PA-C  [facility] 08/05/2014, 10:52 AM   Attending Patient was seen, examined,treatment plan was discussed with the  Advance Practice Provider.  I have directly reviewed the clinical findings, lab, imaging studies and management of this patient in detail. I have made the necessary changes to the above noted documentation, and agree with the documentation, as recorded by the Advance Practice Provider.   In short, improved, no further hypoglycemia. Mental status significantly better. Seen by psychiatry on 10/11-now stable to go back to ALF. Okay for discharge today. Rest as above.  Windell Norfolk MD [facility].

## 2014-08-05 NOTE — Progress Notes (Signed)
DC to [facility] by ambulance at this time

## 2014-08-05 NOTE — Progress Notes (Signed)
Report called to Lurena JoinerRebecca at [facility].

## 2014-08-05 NOTE — Progress Notes (Signed)
CARE MANAGEMENT NOTE 08/05/2014  Patient:  [name],[name]   Account Number:  [id number]  Date Initiated:  08/05/2014  Documentation initiated by:  [facility],Ruston Fedora  Subjective/Objective Assessment:   hypoglycemia, AMS     Action/Plan:   ALF   Anticipated DC Date:  08/05/2014   Anticipated DC Plan:  ASSISTED LIVING / REST HOME  In-house referral  Clinical Social Worker      DC Associate Professorlanning Services  CM consult      [facility]  Resumption Of Svcs/PTA Provider   Choice offered to / List presented to:  C-1 Patient        HH arranged  HH-1 RN      [facility] agency  [facility]   Status of service:  Completed, signed off Medicare Important Message given?  YES (If response is "NO", the following Medicare IM given date fields will be blank) Date Medicare IM given:  08/05/2014 Medicare IM given by:  [facility]Marcquis Ridlon Date Additional Medicare IM given:   Additional Medicare IM given by:    Discharge Disposition:  HOME W HOME HEALTH SERVICES  Per UR Regulation:    If discussed at Long Length of Stay Meetings, dates discussed:    Comments:  08/05/2014 1230 NCM spoke to pt and states he is active with [facility]. He currently resides at [facility]. His sister, [name] is a contact person in case of emergency. No DME needed. NCM spoke to [facility] rep, [place] for resumption of care. Isidoro DonningAlesia Gurtha Picker RN CCM Case Mgmt phone [phone]

## 2014-08-05 NOTE — Clinical Social Work Note (Signed)
Requested documents faxed to [facility]. Waiting for facility's review before sending patient. CSW has left message with Ivor MessierCora, patient's sister.  Roddie McBryant Jeris Roser MSW, [place]WA, [place], [phone]

## 2014-08-05 NOTE — Clinical Social Work Note (Signed)
CSW received call from [facility], patient is allowed to return today. CSW will setup ambulance transport. DC packet on chart along with number for report. CSW unable to reach sister by phone. Multiple messages have been left for sister. CSW signing off at this time.   Roddie McBryant Edessa Jakubowicz MSW, [place], [place], [phone]

## 2014-08-09 LAB — SULFONYLUREA HYPOGLYCEMICS PANEL, SERUM
ACETOHEXAMIDE: NOT DETECTED ng/mL
CHLORPROPAMIDE: NOT DETECTED ng/mL
Glimepiride: NOT DETECTED ng/mL
Glipizide: NOT DETECTED ng/mL
Glyburide: NOT DETECTED ng/mL
Nateglinide: NOT DETECTED ng/mL
REPAGLINIDE: NOT DETECTED ng/mL
TOLAZAMIDE: NOT DETECTED ng/mL
Tolbutamide: NOT DETECTED ng/mL

## 2014-08-13 LAB — PROINSULIN/INSULIN RATIO
INSULIN: 11 u[IU]/mL (ref 3–19)
PROINSULIN: 2.1 pmol/L (ref <=8.0)
Proinsulin/Insulin Ratio: 3.2 % (ref 0.8–21.7)

## 2015-06-24 ENCOUNTER — Ambulatory Visit: Payer: Self-pay

## 2015-07-07 ENCOUNTER — Emergency Department
Admission: EM | Admit: 2015-07-07 | Discharge: 2015-07-08 | Disposition: A | Discharge status: Other Acute Inpatient Hospital | Payer: Medicare Other | Attending: Emergency Medicine | Admitting: Emergency Medicine

## 2015-07-07 ENCOUNTER — Encounter: Payer: Self-pay | Admitting: *Deleted

## 2015-07-07 DIAGNOSIS — I10 Essential (primary) hypertension: Secondary | ICD-10-CM | POA: Insufficient documentation

## 2015-07-07 DIAGNOSIS — Z79899 Other long term (current) drug therapy: Secondary | ICD-10-CM | POA: Diagnosis not present

## 2015-07-07 DIAGNOSIS — Z008 Encounter for other general examination: Secondary | ICD-10-CM | POA: Diagnosis present

## 2015-07-07 DIAGNOSIS — R451 Restlessness and agitation: Secondary | ICD-10-CM | POA: Insufficient documentation

## 2015-07-07 DIAGNOSIS — Z720 Tobacco use: Secondary | ICD-10-CM | POA: Insufficient documentation

## 2015-07-07 DIAGNOSIS — F319 Bipolar disorder, unspecified: Secondary | ICD-10-CM | POA: Diagnosis present

## 2015-07-07 DIAGNOSIS — F29 Unspecified psychosis not due to a substance or known physiological condition: Secondary | ICD-10-CM | POA: Diagnosis present

## 2015-07-07 LAB — COMPREHENSIVE METABOLIC PANEL
ALT: 17 U/L (ref 17–63)
AST: 20 U/L (ref 15–41)
Albumin: 4.3 g/dL (ref 3.5–5.0)
Alkaline Phosphatase: 57 U/L (ref 38–126)
Anion gap: 9 (ref 5–15)
BILIRUBIN TOTAL: 0.6 mg/dL (ref 0.3–1.2)
BUN: 18 mg/dL (ref 6–20)
CHLORIDE: 97 mmol/L — AB (ref 101–111)
CO2: 28 mmol/L (ref 22–32)
Calcium: 9.5 mg/dL (ref 8.9–10.3)
Creatinine, Ser: 1.11 mg/dL (ref 0.61–1.24)
Glucose, Bld: 89 mg/dL (ref 65–99)
POTASSIUM: 3.3 mmol/L — AB (ref 3.5–5.1)
Sodium: 134 mmol/L — ABNORMAL LOW (ref 135–145)
TOTAL PROTEIN: 7.4 g/dL (ref 6.5–8.1)

## 2015-07-07 LAB — CBC
HCT: 38.5 % — ABNORMAL LOW (ref 40.0–52.0)
Hemoglobin: 12.6 g/dL — ABNORMAL LOW (ref 13.0–18.0)
MCH: 28.6 pg (ref 26.0–34.0)
MCHC: 32.9 g/dL (ref 32.0–36.0)
MCV: 86.9 fL (ref 80.0–100.0)
PLATELETS: 204 10*3/uL (ref 150–440)
RBC: 4.43 MIL/uL (ref 4.40–5.90)
RDW: 13.6 % (ref 11.5–14.5)
WBC: 4.8 10*3/uL (ref 3.8–10.6)

## 2015-07-07 LAB — URINE DRUG SCREEN, QUALITATIVE (ARMC ONLY)
AMPHETAMINES, UR SCREEN: NOT DETECTED
Barbiturates, Ur Screen: NOT DETECTED
Benzodiazepine, Ur Scrn: NOT DETECTED
CANNABINOID 50 NG, UR ~~LOC~~: NOT DETECTED
Cocaine Metabolite,Ur ~~LOC~~: NOT DETECTED
MDMA (ECSTASY) UR SCREEN: NOT DETECTED
Methadone Scn, Ur: NOT DETECTED
Opiate, Ur Screen: NOT DETECTED
PHENCYCLIDINE (PCP) UR S: NOT DETECTED
TRICYCLIC, UR SCREEN: NOT DETECTED

## 2015-07-07 LAB — ETHANOL

## 2015-07-07 LAB — ACETAMINOPHEN LEVEL: Acetaminophen (Tylenol), Serum: <10 ug/mL — ABNORMAL LOW (ref 10–30)

## 2015-07-07 LAB — SALICYLATE LEVEL

## 2015-07-07 NOTE — ED Notes (Signed)
Pt is calm, appropriate with staff during triage. Administrator with the pt is instant the pt has to go directly back to a room at this time. Pt to be moved to treatment room as soon as one is available.

## 2015-07-07 NOTE — ED Notes (Signed)
[facility], per the administrator Archie Patten, [phone]), who is with him, she states he has been  "very rude, very harsh, and calling others names" & "he wants to do do something to my other residents, hurt them.", "he has been hearing voices" Per the pt he denies SI and denies that he has verbally been abusive to others, or hallucinations.

## 2015-07-08 DIAGNOSIS — F319 Bipolar disorder, unspecified: Secondary | ICD-10-CM | POA: Diagnosis not present

## 2015-07-08 DIAGNOSIS — R451 Restlessness and agitation: Secondary | ICD-10-CM | POA: Diagnosis not present

## 2015-07-08 MED ORDER — LORAZEPAM 2 MG PO TABS: 2.0000 mg | ORAL_TABLET | Freq: Four times a day (QID) | ORAL | Status: DC | PRN

## 2015-07-08 NOTE — ED Provider Notes (Signed)
[facility] Emergency Department Provider Note  ____________________________________________  Time seen: Approximately 2317  I have reviewed the triage vital signs and the nursing notes.   HISTORY  Chief Complaint Psychiatric Evaluation    HPI [name] is a 54 y.o. male who reports that the woman at his group home was mad at him. He reports that he also got mad at her. He reports that he didn't like how his roommate was acting any had words with him. The patient reports that he did not make verbal threats and he did not threaten to kill his roommate. He reports that he has no problems sleeping next to him. The patient reports that he needs to find another place to stay. He denies any hallucinations and he reports that his problems with his roommate are due to insurance. The patient was sent in by his facility for evaluation.   Past Medical History  Diagnosis Date  . Bipolar 1 disorder   . Hypertension   . Thyroid disease   . GERD (gastroesophageal reflux disease)   . Obstructive sleep apnea   . COPD (chronic obstructive pulmonary disease)     Patient Active Problem List   Diagnosis Date Noted  . Hypoglycemia 08/02/2014  . Psychoses 08/02/2014  . Dyslipidemia 08/02/2014  . Altered mental status 08/01/2013  . Rhabdomyolysis 08/01/2013  . Elevated transaminase level 08/01/2013  . Shortness of breath 10/30/2012  . HTN (hypertension) 10/30/2012  . Hypoxia 10/30/2012  . Bipolar 1 disorder 10/30/2012  . Obstructive sleep apnea 10/30/2012  . Constipation 10/30/2012    History reviewed. No pertinent past surgical history.  Current Outpatient Rx  Name  Route  Sig  Dispense  Refill  . amLODipine (NORVASC) 10 MG tablet   Oral   Take 10 mg by mouth daily.          . divalproex (DEPAKOTE ER) 500 MG 24 hr tablet   Oral   Take 500 mg by mouth 2 (two) times daily.         . hydrochlorothiazide (HYDRODIURIL) 25 MG tablet   Oral   Take 25 mg by  mouth daily.          Marland Kitchen levothyroxine (SYNTHROID, LEVOTHROID) 75 MCG tablet   Oral   Take 75 mcg by mouth daily before breakfast.          . paliperidone (INVEGA) 3 MG 24 hr tablet   Oral   Take 3 mg by mouth 2 (two) times daily. In the morning and at bedtime         . acetaminophen (TYLENOL) 325 MG tablet   Oral   Take 2 tablets (650 mg total) by mouth every 6 (six) hours as needed for mild pain (or Fever >/= 101). Patient not taking: Reported on 07/08/2015         . LORazepam (ATIVAN) 2 MG tablet   Oral   Take 0.5 tablets (1 mg total) by mouth 3 (three) times daily. Patient not taking: Reported on 07/08/2015   30 tablet   0   . LORazepam (ATIVAN) 2 MG tablet   Oral   Take 0.5 tablets (1 mg total) by mouth every 4 (four) hours as needed (agitation). Maximum 2 prn doses in 24 hours Patient not taking: Reported on 07/08/2015   30 tablet   0   . pantoprazole (PROTONIX) 40 MG tablet   Oral   Take 1 tablet (40 mg total) by mouth daily. Patient not taking: Reported on 07/08/2015  30 tablet   0   . zolpidem (AMBIEN) 10 MG tablet   Oral   Take 1 tablet (10 mg total) by mouth at bedtime. Patient not taking: Reported on 07/08/2015   30 tablet   0     Allergies Review of patient's allergies indicates no known allergies.  Family History  Problem Relation Age of Onset  . Cirrhosis Mother   . Hypertension Mother   . Diabetes Mellitus II Mother     Social History Social History  Substance Use Topics  . Smoking status: Current Every Day Smoker -- 0.50 packs/day    Types: Cigarettes  . Smokeless tobacco: None  . Alcohol Use: Yes     Comment: about 2 beers every day.     Review of Systems Constitutional: No fever/chills Eyes: No visual changes. ENT: No sore throat. Cardiovascular: Denies chest pain. Respiratory: Denies shortness of breath. Gastrointestinal: No abdominal pain.  No nausea, no vomiting.  No diarrhea.  No constipation. Genitourinary: Negative  for dysuria. Musculoskeletal: Negative for back pain. Skin: Negative for rash. Neurological: Negative for headaches, focal weakness or numbness.  10-point ROS otherwise negative.  ____________________________________________   PHYSICAL EXAM:  VITAL SIGNS: ED Triage Vitals  Enc Vitals Group     BP 07/07/15 2024 142/93 mmHg     Pulse Rate 07/07/15 2024 81     Resp 07/07/15 2024 18     Temp 07/07/15 2024 98.7 F (37.1 C)     Temp Source 07/07/15 2024 Oral     SpO2 07/07/15 2024 100 %     Weight --      Height --      Head Cir --      Peak Flow --      Pain Score --      Pain Loc --      Pain Edu? --      Excl. in GC? --     Constitutional: Alert and oriented. Well appearing and in no acute distress. Eyes: Conjunctivae are normal. PERRL. EOMI. Head: Atraumatic. Nose: No congestion/rhinnorhea. Mouth/Throat: Mucous membranes are moist.  Oropharynx non-erythematous. Cardiovascular: Normal rate, regular rhythm. Grossly normal heart sounds.  Good peripheral circulation. Respiratory: Normal respiratory effort.  No retractions. Lungs CTAB. Gastrointestinal: Soft and nontender. No distention.  Musculoskeletal: No lower extremity tenderness nor edema.   Neurologic:  Normal speech and language.  Skin:  Skin is warm, dry and intact. No rash noted. Psychiatric: Mood and affect are normal.  ____________________________________________   LABS (all labs ordered are listed, but only abnormal results are displayed)  Labs Reviewed  COMPREHENSIVE METABOLIC PANEL - Abnormal; Notable for the following:    Sodium 134 (*)    Potassium 3.3 (*)    Chloride 97 (*)    All other components within normal limits  ACETAMINOPHEN LEVEL - Abnormal; Notable for the following:    Acetaminophen (Tylenol), Serum <10 (*)    All other components within normal limits  CBC - Abnormal; Notable for the following:    Hemoglobin 12.6 (*)    HCT 38.5 (*)    All other components within normal limits   ETHANOL  SALICYLATE LEVEL  URINE DRUG SCREEN, QUALITATIVE ([facility] ONLY)   ____________________________________________  EKG  None ____________________________________________  RADIOLOGY  None ____________________________________________   PROCEDURES  Procedure(s) performed: None  Critical Care performed: No  ____________________________________________   INITIAL IMPRESSION / ASSESSMENT AND PLAN / ED COURSE  Pertinent labs & imaging results that were available during my care of  the patient were reviewed by me and considered in my medical decision making (see chart for details).  This is a 54 year old male who comes in after having problems with his roommate at his group home. The patient denies any homicidal or suicidal ideation but I will have the patient evaluated by psych for his behaviors. ____________________________________________   FINAL CLINICAL IMPRESSION(S) / ED DIAGNOSES  Final diagnoses:  Agitation      Rebecka Apley, MD 07/08/15 [phone]

## 2015-07-08 NOTE — ED Notes (Signed)
Report received from [facility] pt moved to ED BHU 5.

## 2015-07-08 NOTE — ED Notes (Signed)

## 2015-07-08 NOTE — ED Notes (Signed)
BEHAVIORAL HEALTH ROUNDING Patient sleeping: No. Patient alert and oriented: yes Behavior appropriate: Yes.  ; If no, describe:   Nutrition and fluids offered: Yes , pt given sandwich and drink Toileting and hygiene offered: Yes  Sitter present: yes Law enforcement present: Yes [place]

## 2015-07-08 NOTE — ED Provider Notes (Addendum)
-----------------------------------------   7:29 AM on 07/08/2015 -----------------------------------------   Blood pressure 142/93, pulse 81, temperature 98.7 F (37.1 C), temperature source Oral, resp. rate 18, SpO2 100 %.  The patient had no acute events since last update.  Calm and cooperative at this time.  Disposition is pending per Psychiatry/Behavioral Medicine team recommendations.     Arnaldo Natal, MD 07/08/15 [phone]  Patient has been calm and cooperative here Dr. Mat Carne packs we will discharge him back to the group home.  Arnaldo Natal, MD 07/08/15 [phone]

## 2015-07-08 NOTE — BH Assessment (Signed)
Assessment Note  [name] is an 54 y.o. male. [name] arrived to the ED with staff from his family care home.  [name] appeared to be in a highly talkative mood.  [name] was speaking nonsensically, and answering questions that he may have.  Spoke with [name] at [facility] care home [phone]. She reports that he was having problems, talking mean and hateful, racial things, aggressive towards staff, and he was screaming and hollering at a male resident.  He then took off down the street. This is a new behavior for this gentleman.  He has been at this facility since the middle of July. He carries a diagnosis of schizophrenia. He is reported as taking his medications as prescribed.  He is very educated and at times he becomes very offensive of race and has become scary to some of the residents.  [name] denied suicidal and homicidal Ideation and intent.  He denied being depressed.  Staff reports that [name] has been compliant with his current prescriptions.   Axis I: Chronic Paranoid Schizophrenia Axis II: Deferred Axis III:  Past Medical History  Diagnosis Date  . Bipolar 1 disorder   . [name]   . Thyroid disease   . GERD (gastroesophageal reflux disease)   . Obstructive sleep apnea   . COPD (chronic obstructive pulmonary disease)    Axis IV: housing problems Axis V: 21-30 behavior considerably influenced by delusions or hallucinations OR serious impairment in judgment, communication OR inability to function in almost all areas  Past Medical History:  Past Medical History  Diagnosis Date  . Bipolar 1 disorder   . [name]   . Thyroid disease   . GERD (gastroesophageal reflux disease)   . Obstructive sleep apnea   . COPD (chronic obstructive pulmonary disease)     History reviewed. No pertinent past surgical history.  Family History:  Family History  Problem Relation Age of Onset  . [name]   . [name]   . [name]  Mellitus II [name]     Social History:  reports that he has been smoking Cigarettes.  He has been smoking about 0.50 packs per day. He does not have any smokeless tobacco history on file. He reports that he drinks alcohol. He reports that he does not use illicit drugs.  Additional Social History:  Alcohol / Drug Use History of alcohol / drug use?: Yes (History of substance usage, no use within the last 6 months)  CIWA: CIWA-Ar BP: (!) 142/93 mmHg Pulse Rate: 81 COWS:    Allergies: No Known Allergies  Home Medications:  (Not in a hospital admission)  OB/GYN Status:  No LMP for male patient.  General Assessment Data Location of Assessment: [facility] ED TTS Assessment: In system Is this a Tele or Face-to-Face Assessment?: Face-to-Face Is this an Initial Assessment or a Re-assessment for this encounter?: Initial Assessment Marital status: Single Maiden name: n/a Is patient pregnant?: No Pregnancy Status: No Living Arrangements: Other (Comment) (Resident of [facility]) Can pt return to current living arrangement?: Yes Admission Status: Involuntary Is patient capable of signing voluntary admission?: Yes Referral Source: Self/Family/Friend Insurance type: Medicare  Medical Screening Exam [facility] Walk-in ONLY) Medical Exam completed: Yes  Crisis Care Plan Living Arrangements: Other (Comment) (Resident of [facility]) Name of Psychiatrist: None reported Name of Therapist: None reported  Education Status Is patient currently in school?: No Current Grade: n/a Highest grade of school patient has completed: [employer]  Name of school: [employer] Contact person: n/a  Risk to self with the past 6 months Suicidal Ideation: No Has patient been a risk to self within the past 6 months prior to admission? : No Suicidal Intent: No Has patient had any suicidal intent within the past 6 months prior to admission? : No Is patient at risk for  suicide?: No Suicidal Plan?: No Has patient had any suicidal plan within the past 6 months prior to admission? : No Access to Means: No What has been your use of drugs/alcohol within the last 12 months?: None used currently Previous Attempts/Gestures: No How many times?: 0 Other Self Harm Risks: 0 Triggers for Past Attempts: None known Intentional Self Injurious Behavior: None Family Suicide History: Unknown Recent stressful life event(s):  (None reported) Persecutory voices/beliefs?: No Depression: No Depression Symptoms:  (None reported) Suicide prevention information given to non-admitted patients: Not applicable  Risk to Others within the past 6 months Homicidal Ideation: No Does patient have any lifetime risk of violence toward others beyond the six months prior to admission? : Unknown Thoughts of Harm to Others: No Current Homicidal Intent: No Current Homicidal Plan: No Access to Homicidal Means: No Identified Victim: None reported History of harm to others?:  (Unknown) Assessment of Violence: None Noted Violent Behavior Description:  (screaming and being aggressive at residential facility, ) Does patient have access to weapons?: No Criminal Charges Pending?: No Does patient have a court date: No Is patient on probation?: No  Psychosis Hallucinations: None noted Delusions: None noted  Mental Status Report Appearance/Hygiene: In scrubs Eye Contact: Poor Motor Activity: Restlessness Speech: Loud Level of Consciousness: Alert Mood: Elated Affect: Euphoric Anxiety Level: None Thought Processes: Flight of Ideas Judgement: Impaired Orientation: Place, Situation Obsessive Compulsive Thoughts/Behaviors: None  Cognitive Functioning Concentration: Poor Memory: Unable to Assess IQ: Above Average Insight: Poor Impulse Control: Poor Appetite: Good Sleep: Unable to Assess Vegetative Symptoms: None  ADLScreening [facility] Assessment Services) Patient's cognitive  ability adequate to safely complete daily activities?: Yes Patient able to express need for assistance with ADLs?: Yes Independently performs ADLs?: Yes (appropriate for developmental age)     Prior Outpatient Therapy Does patient have an ACCT team?: No Does patient have Intensive In-House Services?  : No Does patient have [facility] services? : No Does patient have P4CC services?: No  ADL Screening (condition at time of admission) Patient's cognitive ability adequate to safely complete daily activities?: Yes Patient able to express need for assistance with ADLs?: Yes Independently performs ADLs?: Yes (appropriate for developmental age)       Abuse/Neglect Assessment (Assessment to be complete while patient is alone) Physical Abuse: Denies Verbal Abuse: Denies Sexual Abuse: Denies Exploitation of patient/patient's resources: Denies Self-Neglect: Denies Values / Beliefs Cultural Requests During Hospitalization: None Spiritual Requests During Hospitalization: None        Additional Information 1:1 In Past 12 Months?: No CIRT Risk: No Elopement Risk: No Does patient have medical clearance?: Yes     Disposition:  Disposition Initial Assessment Completed for this Encounter: Yes Disposition of Patient: Referred to (to be seen by the psychiatrist)  On Site Evaluation by:   Reviewed with Physician:    Justice Deeds 07/08/2015 1:18 AM

## 2015-07-08 NOTE — ED Notes (Signed)
ED BHU PLACEMENT JUSTIFICATION Is the patient under IVC or is there intent for IVC:No  Is the patient medically cleared: Yes.   Is there vacancy in the ED BHU: Yes.   Is the population mix appropriate for patient: Yes.   Is the patient awaiting placement in inpatient or outpatient setting: Yes.   Has the patient had a psychiatric consult: Yes.   Survey of unit performed for contraband, proper placement and condition of furniture, tampering with fixtures in bathroom, shower, and each patient room: Yes.  ; Findings:  APPEARANCE/BEHAVIOR calm, cooperative and adequate rapport can be established NEURO ASSESSMENT Orientation: time, place and person Hallucinations: Yes.  Auditory Hallucinations Speech: Normal Gait: normal RESPIRATORY ASSESSMENT Normal expansion.  Clear to auscultation.  No rales, rhonchi, or wheezing. CARDIOVASCULAR ASSESSMENT regular rate and rhythm, S1, S2 normal, no murmur, click, rub or gallop GASTROINTESTINAL ASSESSMENT soft, nontender, BS WNL, no r/g EXTREMITIES normal strength, tone, and muscle mass PLAN OF CARE Provide calm/safe environment. Vital signs assessed twice daily. ED BHU Assessment once each 12-hour shift. Collaborate with intake RN daily or as condition indicates. Assure the ED provider has rounded once each shift. Provide and encourage hygiene. Provide redirection as needed. Assess for escalating behavior; address immediately and inform ED provider.  Assess family dynamic and appropriateness for visitation as needed: Yes.  ; If necessary, describe findings:  Educate the patient/family about BHU procedures/visitation: Yes.  ; If necessary, describe findings:

## 2015-07-08 NOTE — Consult Note (Signed)
[facility] Face-to-Face Psychiatry Consult   Reason for Consult:  Consult for this 54 year old man with a history of schizoaffective disorder who brought himself voluntarily to the emergency room. Chief complaint "I don't know" Referring Physician:  Cinda Quest Patient Identification: [name] MRN:  [id number] Principal Diagnosis: Bipolar 1 disorder Diagnosis:   Patient Active Problem List   Diagnosis Date Noted  . Hypoglycemia [E16.2] 08/02/2014  . Psychoses [F29] 08/02/2014  . Dyslipidemia [E78.5] 08/02/2014  . Altered mental status [R41.82] 08/01/2013  . Rhabdomyolysis [M62.82] 08/01/2013  . Elevated transaminase level [R74.0] 08/01/2013  . Shortness of breath [R06.02] 10/30/2012  . HTN (hypertension) [I10] 10/30/2012  . Hypoxia [R09.02] 10/30/2012  . Bipolar 1 disorder [F31.9] 10/30/2012  . Obstructive sleep apnea [G47.33] 10/30/2012  . Constipation [K59.00] 10/30/2012    Total Time spent with patient: 1 hour  Subjective:   [name] is a 54 y.o. male patient admitted with patient states that he doesn't know why he is here but then goes on to detail how he had called the police and asked them to bring him here.Marland Kitchen  HPI:  Information from the patient and the chart. Patient is disorganized in his thinking and vague in his recollections. Apparently he called the police and asked them to bring him to the hospital. The reasons he gives are mostly minor complaints about how he is treated at his group home. He doesn't feel that the food is quite up to his standard and he seems to feel like sometimes they don't treat him with the most respect. He apparently had been wandering away from the group home and may have gotten into some kind of argument with them. Patient completely denies having any thoughts about killing anyone hurting anyone. Denies any thoughts of hurting or killing himself. Says his mood more or less has been okay. Sleeps okay. He says he has been compliant with his medicines of  Depakote and Zyprexa and blood pressure medicines that were given him by [facility]. He made it clear to me that his chief complaint was wanting to try and move away from his group home.  Past psychiatric history: Documented history of recurrent psychotic disorder either schizoaffective or bipolar disorder. Patient tells me that he first started to get mental health treatment during his last year of college. Has been disabled for probably more than 20 years. Has been on multiple medications. The only past when he can remember his Thorazine. Knows that he is taking Depakote and Zyprexa and now although the chart indicates he may be taking Invega. 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 Patient denies that he has tried to kill himself denies any history of violent behavior. Says that he does not usually hear voices.  Social history: Apparently his sister is his closest relative although not clear if she is his guardian. He lives at a group home where he has been by his reckoning for about 6 months. Last hospitalization was at [facility] a couple months ago. Says that he doesn't keep in touch with much of his family. He has a grown child he doesn't keep in touch with. Went to college and worked briefly afterwards but has been disabled for a long time.  Medical history: High blood pressure history of past neural uptake malignant syndrome, COPD, past history of rhabdomyolysis and hypoglycemia  Family history: He does not know of any family history of mental illness  Substance abuse history: Denies  that he drinks alcohol or abuses any drugs currently says that years ago there was a time probably almost 20 years ago when he drank too much but gave it up.  Current medication: Patient knows he is taking Depakote the rest of the medicines he's a little vague on. See the intake list of medicines HPI Elements:   Quality:  Psychosis mood  instability. Severity:  Moderate to severe. Timing:  Chronic unclear if there's been a new worsening. Duration:  Chronic ongoing condition. Context:  Chronic illness dislike of his group home several medical problems.  Past Medical History:  Past Medical History  Diagnosis Date  . Bipolar 1 disorder   . Hypertension   . Thyroid disease   . GERD (gastroesophageal reflux disease)   . Obstructive sleep apnea   . COPD (chronic obstructive pulmonary disease)    History reviewed. No pertinent past surgical history. Family History:  Family History  Problem Relation Age of Onset  . Cirrhosis Mother   . Hypertension Mother   . Diabetes Mellitus II Mother    Social History:  History  Alcohol Use  . Yes    Comment: about 2 beers every day.      History  Drug Use No    Social History   Social History  . Marital Status: Single    Spouse Name: N/A  . Number of Children: N/A  . Years of Education: N/A   Social History Main Topics  . Smoking status: Current Every Day Smoker -- 0.50 packs/day    Types: Cigarettes  . Smokeless tobacco: None  . Alcohol Use: Yes     Comment: about 2 beers every day.   . Drug Use: No  . Sexual Activity: Yes   Other Topics Concern  . None   Social History Narrative   Additional Social History:    History of alcohol / drug use?: Yes (History of substance usage, no use within the last 6 months)                     Allergies:  No Known Allergies  Labs:  Results for orders placed or performed during the hospital encounter of 07/07/15 (from the past 48 hour(s))  Comprehensive metabolic panel     Status: Abnormal   Collection Time: 07/07/15  8:30 PM  Result Value Ref Range   Sodium 134 (L) 135 - 145 mmol/L   Potassium 3.3 (L) 3.5 - 5.1 mmol/L   Chloride 97 (L) 101 - 111 mmol/L   CO2 28 22 - 32 mmol/L   Glucose, Bld 89 65 - 99 mg/dL   BUN 18 6 - 20 mg/dL   Creatinine, Ser 1.11 0.61 - 1.24 mg/dL   Calcium 9.5 8.9 - 10.3 mg/dL    Total Protein 7.4 6.5 - 8.1 g/dL   Albumin 4.3 3.5 - 5.0 g/dL   AST 20 15 - 41 U/L   ALT 17 17 - 63 U/L   Alkaline Phosphatase 57 38 - 126 U/L   Total Bilirubin 0.6 0.3 - 1.2 mg/dL   GFR calc non Af Amer >60 >60 mL/min   GFR calc Af Amer >60 >60 mL/min    Comment: (NOTE) The eGFR has been calculated using the CKD EPI equation. This calculation has not been validated in all clinical situations. eGFR's persistently <60 mL/min signify possible Chronic Kidney Disease.    Anion gap 9 5 - 15  Ethanol (ETOH)     Status: None   Collection  Time: 07/07/15  8:30 PM  Result Value Ref Range   Alcohol, Ethyl (B) <5 <5 mg/dL    Comment:        LOWEST DETECTABLE LIMIT FOR SERUM ALCOHOL IS 5 mg/dL FOR MEDICAL PURPOSES ONLY   Salicylate level     Status: None   Collection Time: 07/07/15  8:30 PM  Result Value Ref Range   Salicylate Lvl <3.7 2.8 - 30.0 mg/dL  Acetaminophen level     Status: Abnormal   Collection Time: 07/07/15  8:30 PM  Result Value Ref Range   Acetaminophen (Tylenol), Serum <10 (L) 10 - 30 ug/mL    Comment:        THERAPEUTIC CONCENTRATIONS VARY SIGNIFICANTLY. A RANGE OF 10-30 ug/mL MAY BE AN EFFECTIVE CONCENTRATION FOR MANY PATIENTS. HOWEVER, SOME ARE BEST TREATED AT CONCENTRATIONS OUTSIDE THIS RANGE. ACETAMINOPHEN CONCENTRATIONS >150 ug/mL AT 4 HOURS AFTER INGESTION AND >50 ug/mL AT 12 HOURS AFTER INGESTION ARE OFTEN ASSOCIATED WITH TOXIC REACTIONS.   CBC     Status: Abnormal   Collection Time: 07/07/15  8:30 PM  Result Value Ref Range   WBC 4.8 3.8 - 10.6 K/uL   RBC 4.43 4.40 - 5.90 MIL/uL   Hemoglobin 12.6 (L) 13.0 - 18.0 g/dL   HCT 38.5 (L) 40.0 - 52.0 %   MCV 86.9 80.0 - 100.0 fL   MCH 28.6 26.0 - 34.0 pg   MCHC 32.9 32.0 - 36.0 g/dL   RDW 13.6 11.5 - 14.5 %   Platelets 204 150 - 440 K/uL  Urine Drug Screen, Qualitative ([facility] only)     Status: None   Collection Time: 07/07/15  8:30 PM  Result Value Ref Range   Tricyclic, Ur Screen NONE DETECTED NONE  DETECTED   Amphetamines, Ur Screen NONE DETECTED NONE DETECTED   MDMA (Ecstasy)Ur Screen NONE DETECTED NONE DETECTED   Cocaine Metabolite,Ur [place] NONE DETECTED NONE DETECTED   Opiate, Ur Screen NONE DETECTED NONE DETECTED   Phencyclidine (PCP) Ur S NONE DETECTED NONE DETECTED   Cannabinoid 50 Ng, Ur [place] NONE DETECTED NONE DETECTED   Barbiturates, Ur Screen NONE DETECTED NONE DETECTED   Benzodiazepine, Ur Scrn NONE DETECTED NONE DETECTED   Methadone Scn, Ur NONE DETECTED NONE DETECTED    Comment: (NOTE) 628  Tricyclics, urine               Cutoff 1000 ng/mL 200  Amphetamines, urine             Cutoff 1000 ng/mL 300  MDMA (Ecstasy), urine           Cutoff 500 ng/mL 400  Cocaine Metabolite, urine       Cutoff 300 ng/mL 500  Opiate, urine                   Cutoff 300 ng/mL 600  Phencyclidine (PCP), urine      Cutoff 25 ng/mL 700  Cannabinoid, urine              Cutoff 50 ng/mL 800  Barbiturates, urine             Cutoff 200 ng/mL 900  Benzodiazepine, urine           Cutoff 200 ng/mL 1000 Methadone, urine                Cutoff 300 ng/mL 1100 1200 The urine drug screen provides only a preliminary, unconfirmed 1300 analytical test result and should not be used for non-medical 1400 purposes.  Clinical consideration and professional judgment should 1500 be applied to any positive drug screen result due to possible 1600 interfering substances. A more specific alternate chemical method 1700 must be used in order to obtain a confirmed analytical result.  1800 Gas chromato graphy / mass spectrometry (GC/MS) is the preferred 1900 confirmatory method.     Vitals: Blood pressure 141/106, pulse 95, temperature 98 F (36.7 C), temperature source Oral, resp. rate 18, SpO2 98 %.  Risk to Self: Suicidal Ideation: No Suicidal Intent: No Is patient at risk for suicide?: No Suicidal Plan?: No Access to Means: No What has been your use of drugs/alcohol within the last 12 months?: None used  currently How many times?: 0 Other Self Harm Risks: 0 Triggers for Past Attempts: None known Intentional Self Injurious Behavior: None Risk to Others: Homicidal Ideation: No Thoughts of Harm to Others: No Current Homicidal Intent: No Current Homicidal Plan: No Access to Homicidal Means: No Identified Victim: None reported History of harm to others?:  (Unknown) Assessment of Violence: None Noted Violent Behavior Description:  (screaming and being aggressive at residential facility, ) Does patient have access to weapons?: No Criminal Charges Pending?: No Does patient have a court date: No Prior Inpatient Therapy:   Prior Outpatient Therapy: Does patient have an ACCT team?: No Does patient have Intensive In-House Services?  : No Does patient have [facility] services? : No Does patient have P4CC services?: No  No current facility-administered medications for this encounter.   No current outpatient prescriptions on file.    Musculoskeletal: Strength & Muscle Tone: within normal limits Gait & Station: normal Patient leans: N/A  Psychiatric Specialty Exam: Physical Exam  Nursing note and vitals reviewed. Constitutional: He appears well-developed and well-nourished.  HENT:  Head: Normocephalic and atraumatic.  Eyes: Conjunctivae are normal. Pupils are equal, round, and reactive to light.  Neck: Normal range of motion.  Cardiovascular: Normal heart sounds.   Respiratory: Effort normal.  GI: Soft.  Musculoskeletal: Normal range of motion.  Neurological: He is alert.  Skin: Skin is warm and dry.  Psychiatric: His behavior is normal. His affect is labile. His speech is tangential. Thought content is delusional. Cognition and memory are impaired. He expresses impulsivity. He exhibits abnormal remote memory.    Review of Systems  Constitutional: Negative.   HENT: Negative.   Eyes: Negative.   Respiratory: Negative.   Cardiovascular: Negative.   Gastrointestinal: Negative.    Musculoskeletal: Negative.   Skin: Negative.   Neurological: Negative.   Psychiatric/Behavioral: Positive for memory loss. Negative for depression, suicidal ideas, hallucinations and substance abuse. The patient is nervous/anxious.     Blood pressure 141/106, pulse 95, temperature 98 F (36.7 C), temperature source Oral, resp. rate 18, SpO2 98 %.There is no weight on file to calculate BMI.  General Appearance: Fairly Groomed  Engineer, water::  Fair  Speech:  Pressured  Volume:  Normal  Mood:  Irritable  Affect:  Labile  Thought Process:  Disorganized  Orientation:  Full (Time, Place, and Person)  Thought Content:  Delusions  Suicidal Thoughts:  No  Homicidal Thoughts:  No  Memory:  Immediate;   Good Recent;   Fair Remote;   Fair  Judgement:  Impaired  Insight:  Lacking  Psychomotor Activity:  Restlessness  Concentration:  Fair  Recall:  [place]  Language: Fair  Akathisia:  No  Handed:  Right  AIMS (if indicated):     Assets:  Agricultural consultant Housing Social  Support  ADL's:  Intact  Cognition: Impaired,  Mild  Sleep:      Medical Decision Making: Review of Psycho-Social Stressors (1), Review or order clinical lab tests (1), Discuss test with performing physician (1), Established Problem, Worsening (2), Review of Medication Regimen & Side Effects (2) and Review of New Medication or Change in Dosage (2)  Treatment Plan Summary: Medication management and Plan This is a 54 year old man with schizoaffective disorder who had a brought to the emergency room. Although he is a little bit agitated he is not threatening and there is no evidence that he is acutely dangerous to self or others. Patient has appropriate outpatient care in the community and a safe place to live. Blood pressure is elevated rest of the lab tests are unremarkable. Patient has been counseled about the importance of staying compliant with his medicine and  talking to his outpatient providers. Case discussed with emergency room. Patient will be released at the discretion of the emergency room physician.  Plan:  Patient does not meet criteria for psychiatric inpatient admission. Supportive therapy provided about ongoing stressors. Discussed crisis plan, support from social network, calling 911, coming to the Emergency Department, and calling Suicide Hotline. Disposition: Discharge with no new prescriptions  John Clapacs 07/08/2015 1:18 PM

## 2015-07-08 NOTE — ED Notes (Signed)
Pt wants shower 

## 2015-07-08 NOTE — ED Notes (Signed)
Pt calm at this time, no complaints noted.

## 2015-07-08 NOTE — ED Notes (Signed)
ENVIRONMENTAL ASSESSMENT Potentially harmful objects out of patient reach: Yes.   Personal belongings secured: Yes.   Patient dressed in hospital provided attire only: Yes.   Plastic bags out of patient reach: Yes.   Patient care equipment (cords, cables, call bells, lines, and drains) shortened, removed, or accounted for: Yes.   Equipment and supplies removed from bottom of stretcher: Yes.   Potentially toxic materials out of patient reach: Yes.   Sharps container removed or out of patient reach: Yes.     Patient resting comfortably in room. No complaints or concerns voiced. No distress or abnormal behavior noted. Will continue to monitor with security cameras. Q 15 minute rounds continue.  

## 2015-07-08 NOTE — ED Notes (Signed)
Patient resting comfortably in room. No complaints or concerns voiced. No distress or abnormal behavior noted. Will continue to monitor with security cameras. Q 15 minute rounds continue. 

## 2015-07-08 NOTE — ED Notes (Signed)
Maintained on 15 minute checks and observation by security camera for safety. Patient is anticipating discharge back to group home.

## 2015-07-08 NOTE — ED Notes (Signed)

## 2015-07-08 NOTE — ED Notes (Signed)
Patient in room, resting. He states he does not want to go back to his group home because of conflict with his roommate, whom he wants to hurt. He also does not like the group home Production designer, theatre/television/film.  Patient noted to be talking to himself in his room. He would not answer questions regarding hallucinations. Will continue to monitor mental status and behaviors. Patient will remain on 15 minute checks and observation by security cameras for safety.

## 2015-07-08 NOTE — Discharge Instructions (Signed)
Dysphoria Dysphoria is a condition in which a person feels unpleasant or uncomfortable.  CAUSES  Dysphoria has many possible causes, including:  Anxiety disorders.  Psychiatric disorders.  Withdrawal from drugs or alcohol.  Mood disorders.  Manic disorders.  Transgender disorders.  Reactions to medications.  Personality disorders.  Body dysmorphic disorders. SYMPTOMS  It may include mood changes such as:  Sadness.  Anxiety.  Irritability.  Restlessness. It may just be a feeling of wanting to crawl out of your skin. TREATMENT  You can discuss the treatment of how you feel with your caregiver. Once they have diagnosed the cause, they can treat it. Document Released: 03/22/2006 Document Revised: 01/03/2012 Document Reviewed: 07/28/2006 [facility] Patient Information 2015 [place], [place]. This information is not intended to replace advice given to you by your health care provider. Make sure you discuss any questions you have with your health care provider.   Please continue to follow-up with your doctor  return here as needed

## 2015-07-08 NOTE — ED Notes (Signed)
ENVIRONMENTAL ASSESSMENT Potentially harmful objects out of patient reach: Yes.   Personal belongings secured: Yes.   Patient dressed in hospital provided attire only: Yes.   Plastic bags out of patient reach: Yes.   Patient care equipment (cords, cables, call bells, lines, and drains) shortened, removed, or accounted for: Yes.   Equipment and supplies removed from bottom of stretcher: Yes.   Potentially toxic materials out of patient reach: Yes.   Sharps container removed or out of patient reach: Yes.  , the pt is in the hallway

## 2015-07-08 NOTE — B[facility] Counselor (Signed)
Writer called and spoke patient Group Home, [facility] ([phone]). Informed them the patient is discharging and need to arrange a time for pick up. Group Home Staff requested medications that will help calm him down. She stated, the patient had the other residents upset on last night.  Writer contacted the Psych MD (Dr. Toni Amend) about the Group Home request. He wrote a prescription for agitation. Patient current nurse (Amy H.) spoke with the Care home about the prescriptions.  Another number to contact the Care Home is ([phone])

## 2015-07-08 NOTE — ED Notes (Signed)
Patient discharged ambulatory to caregiver at group home. He denies SI or HI.  Discharge instructions and prescriptions reviewed with caregiver. Patient received all personal belongings.

## 2015-07-08 NOTE — ED Notes (Signed)
Patient in dayroom. He is aware that he is to be discharged back to group home today. He is requesting that group home manager pick him up by 4:30 pm; he was told that this could not be guaranteed.

## 2015-07-08 NOTE — ED Notes (Signed)
BEHAVIORAL HEALTH ROUNDING Patient sleeping: No. Patient alert and oriented: yes Behavior appropriate: No.; If no, describe:  Nutrition and fluids offered: Yes  Toileting and hygiene offered: Yes  Sitter present: yes Law enforcement present: Yes - [employer]

## 2015-07-08 NOTE — ED Notes (Signed)
Patient in good spirits with bright affect. No evidence of any aggressive behaviors. Will continue to closely monitor.

## 2015-07-08 NOTE — ED Notes (Signed)
Patient in good spirits. Meal given. Maintained on 15 minute checks and observation by security camera for safety.

## 2015-07-08 NOTE — ED Notes (Signed)
Pt. transfered to [facility] without incident after report from. Placed in room and oriented to unit. Pt. informed that for their safety all care areas are designed for safety and monitored by security cameras at all times; and visiting hours explained to patient. Patient verbalizes understanding, and verbal contract for safety obtained.   

## 2015-09-28 IMAGING — CT CT HEAD W/O CM
1 series · 16 of 30 positions shown, 20 images · non-contrast
Comparison: Noncontrast CT scan of the brain August 01, 2013

CLINICAL DATA: Altered mental status, disoriented; history of
bipolar disorder and hypertension and thyroid disease

EXAM:
CT HEAD WITHOUT CONTRAST
TECHNIQUE: Contiguous axial images were obtained from the base of the skull
through the vertex without intravenous contrast.

[Series 2: head 5.0 h30s · axial · 0.41mm/px · z∈[-159,-9]mm · 16 of 34 slices shown, 20 images]
[im 2/34  brain]
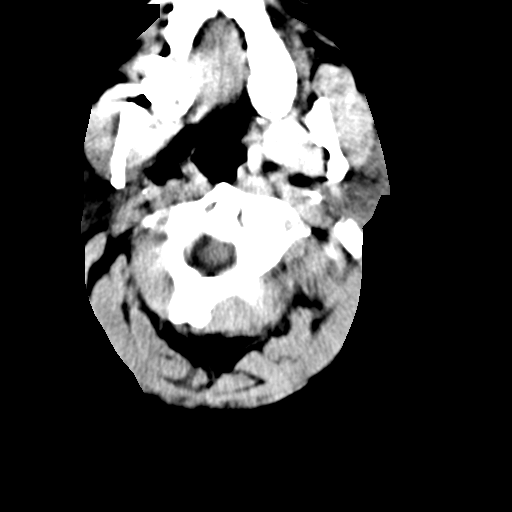
[im 2/34  bone]
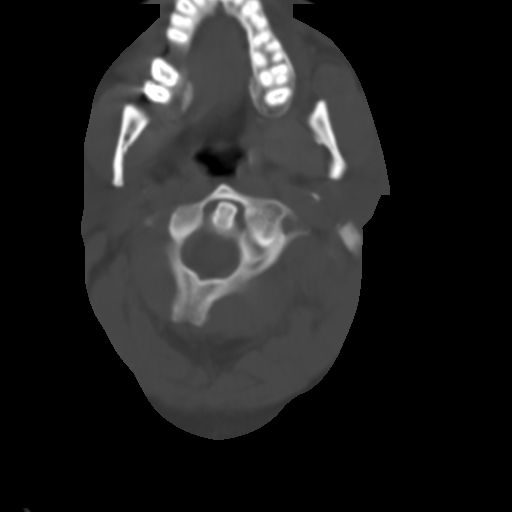
[im 4/34  brain]
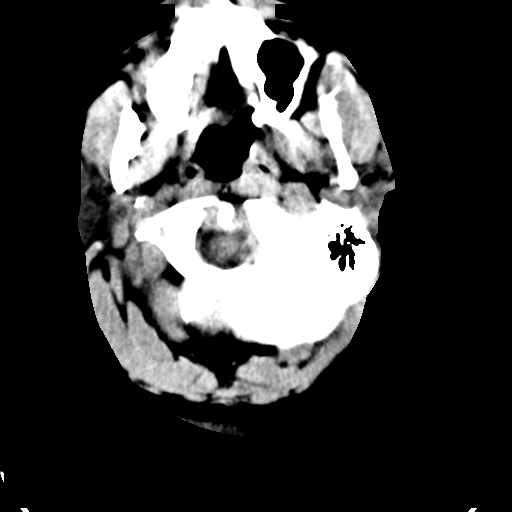
[im 6/34  brain]
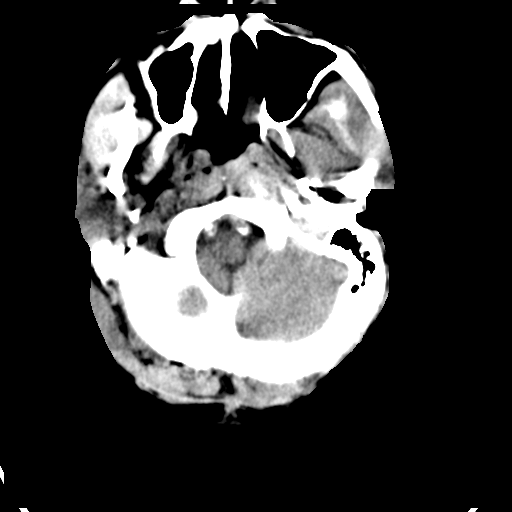
[im 8/34  brain]
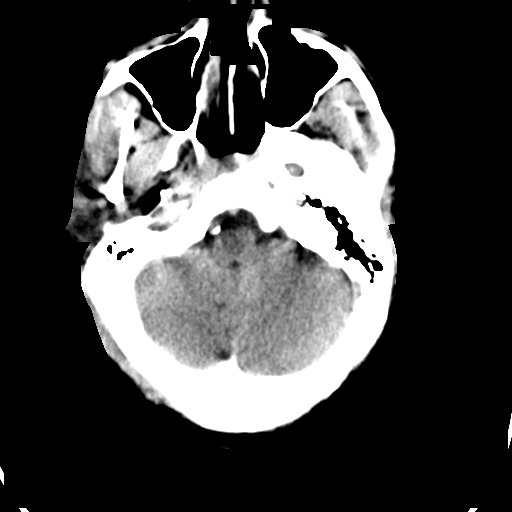
[im 10/34  brain]
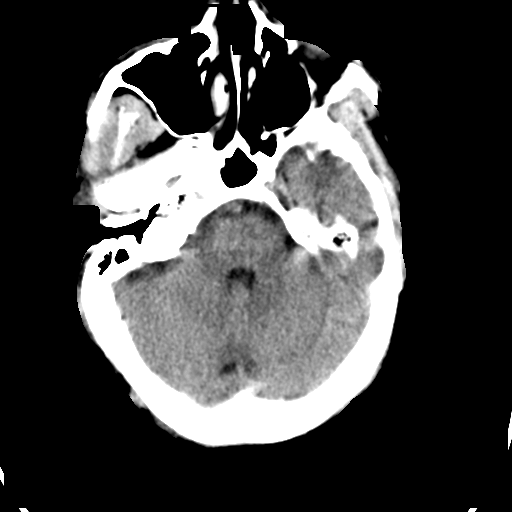
[im 10/34  bone]
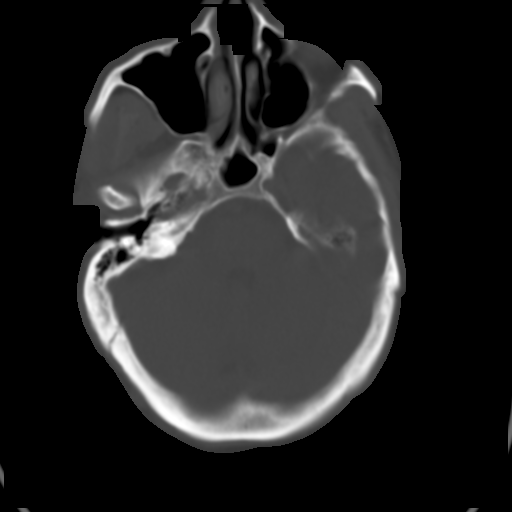
[im 12/34  brain]
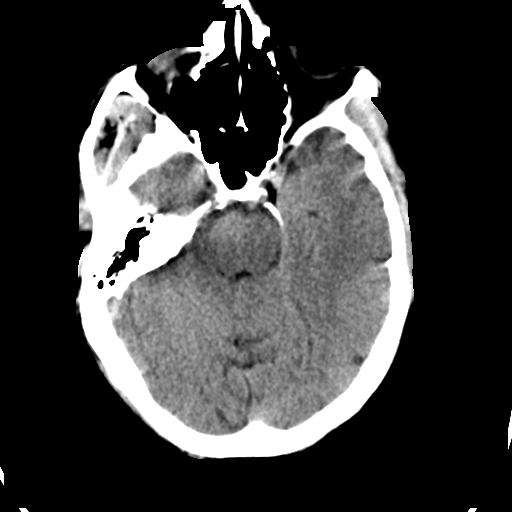
[im 14/34  brain]
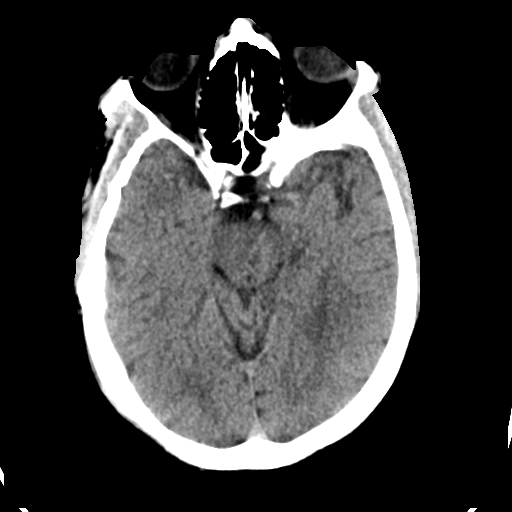
[im 16/34  brain]
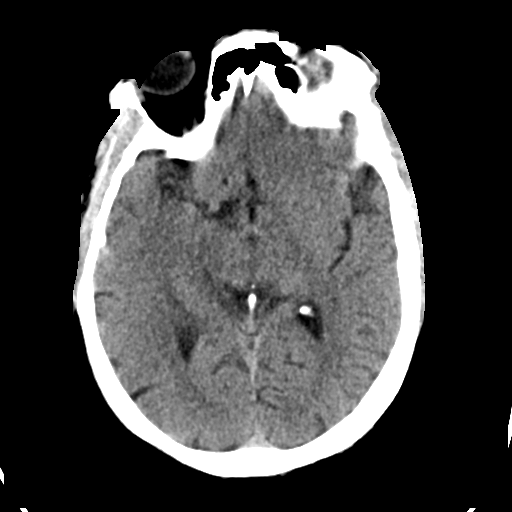
[im 18/34  brain]
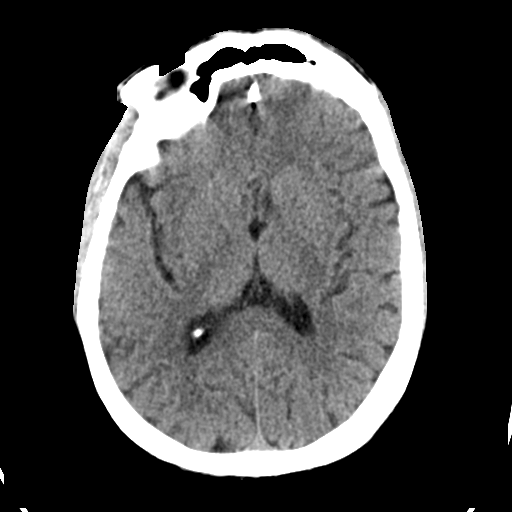
[im 18/34  bone]
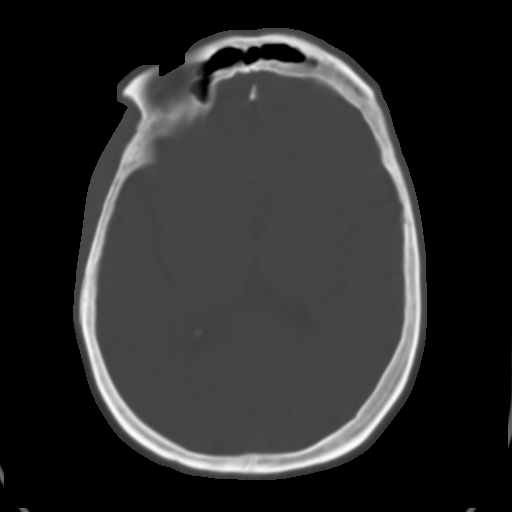
[im 20/34  brain]
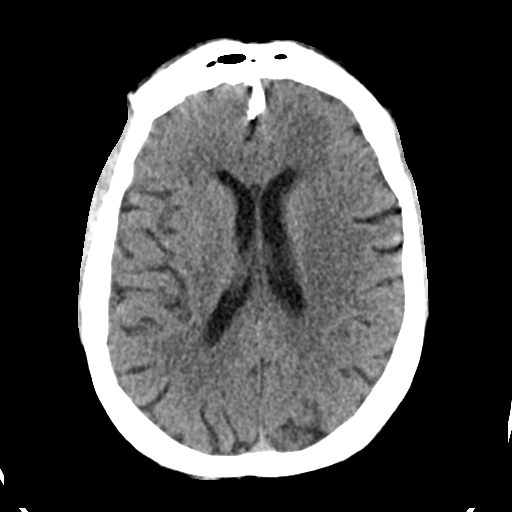
[im 22/34  brain]
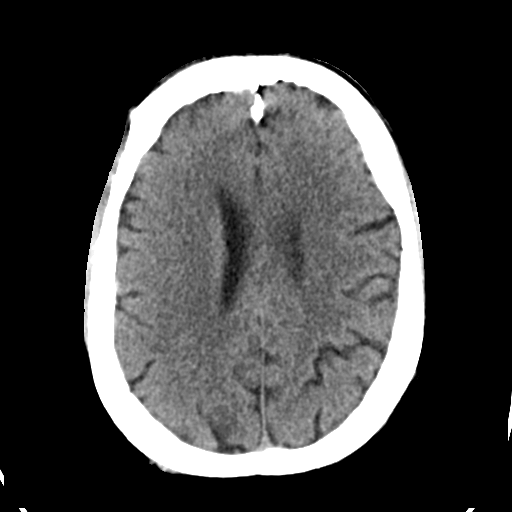
[im 24/34  brain]
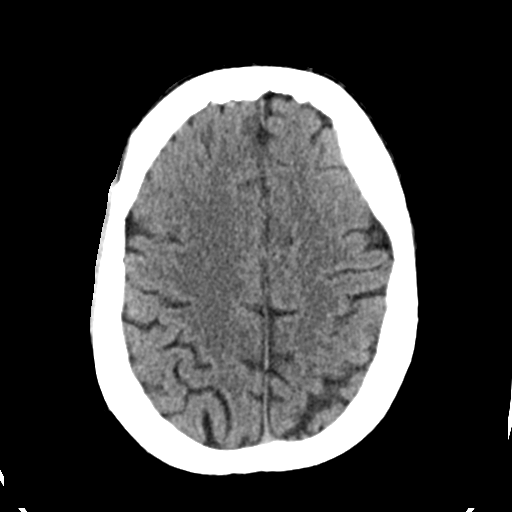
[im 26/34  brain]
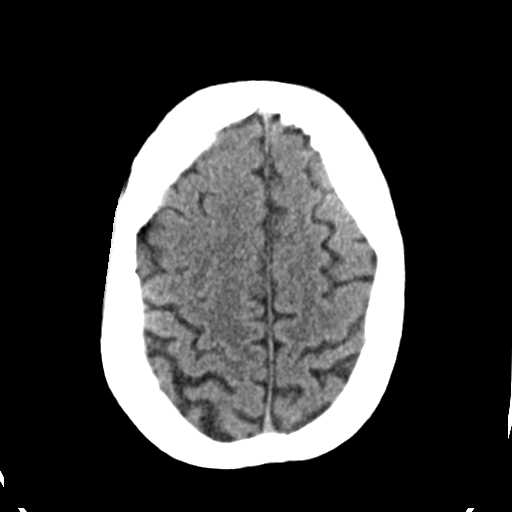
[im 26/34  bone]
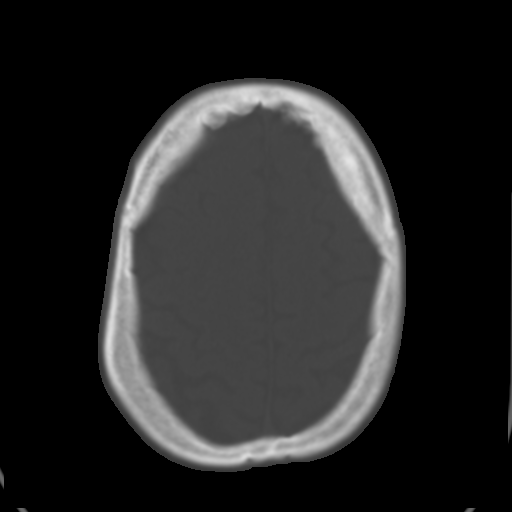
[im 28/34  brain]
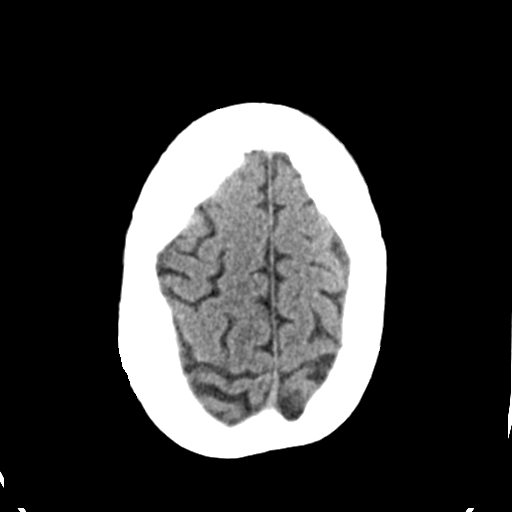
[im 30/34  brain]
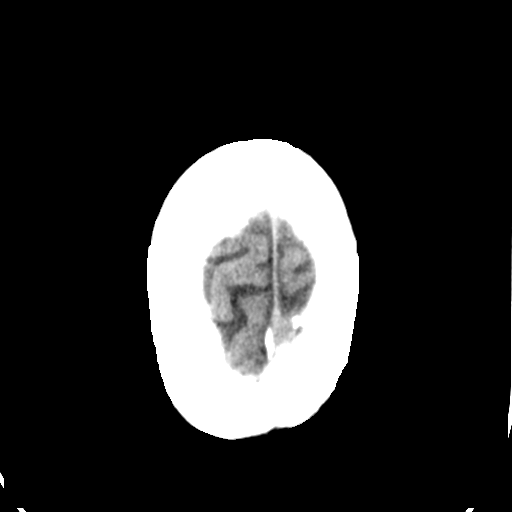
[im 32/34  brain]
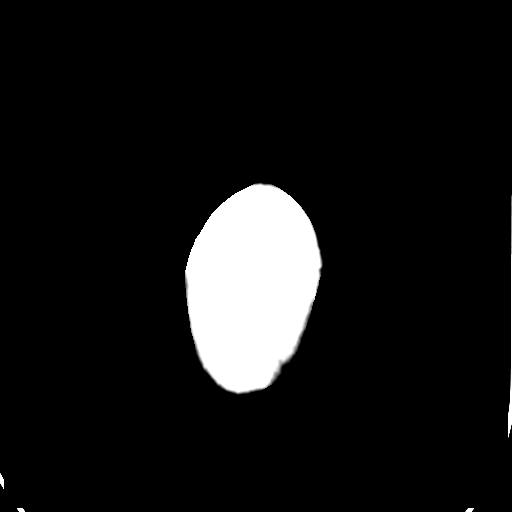

[16 of 30 positions shown; findings below may reference images not displayed]

FINDINGS: The ventricles are normal in size and position. There is no
intracranial hemorrhage nor intracranial mass effect. There is no
acute ischemic change. The cerebellum and brainstem are
unremarkable.

The observed paranasal sinuses and mastoid air cells are clear.
There is no acute skull fracture.
IMPRESSION: There is no acute intracranial abnormality. There is no acute skull
fracture.

## 2015-10-02 ENCOUNTER — Emergency Department (HOSPITAL_COMMUNITY)
Admission: EM | Admit: 2015-10-02 | Discharge: 2015-10-02 | Disposition: A | Discharge status: Home / Self Care | Payer: Medicare Other | Attending: Emergency Medicine | Admitting: Emergency Medicine

## 2015-10-02 ENCOUNTER — Encounter (HOSPITAL_COMMUNITY): Payer: Self-pay

## 2015-10-02 DIAGNOSIS — I10 Essential (primary) hypertension: Secondary | ICD-10-CM | POA: Diagnosis not present

## 2015-10-02 DIAGNOSIS — Z79899 Other long term (current) drug therapy: Secondary | ICD-10-CM | POA: Insufficient documentation

## 2015-10-02 DIAGNOSIS — F319 Bipolar disorder, unspecified: Secondary | ICD-10-CM | POA: Insufficient documentation

## 2015-10-02 DIAGNOSIS — Z8669 Personal history of other diseases of the nervous system and sense organs: Secondary | ICD-10-CM | POA: Insufficient documentation

## 2015-10-02 DIAGNOSIS — J449 Chronic obstructive pulmonary disease, unspecified: Secondary | ICD-10-CM | POA: Diagnosis not present

## 2015-10-02 DIAGNOSIS — F17210 Nicotine dependence, cigarettes, uncomplicated: Secondary | ICD-10-CM | POA: Diagnosis not present

## 2015-10-02 DIAGNOSIS — E079 Disorder of thyroid, unspecified: Secondary | ICD-10-CM | POA: Insufficient documentation

## 2015-10-02 DIAGNOSIS — K219 Gastro-esophageal reflux disease without esophagitis: Secondary | ICD-10-CM | POA: Diagnosis not present

## 2015-10-02 DIAGNOSIS — R079 Chest pain, unspecified: Secondary | ICD-10-CM | POA: Diagnosis not present

## 2015-10-02 LAB — I-STAT TROPONIN, ED
TROPONIN I, POC: 0 ng/mL (ref 0.00–0.08)
Troponin i, poc: 0.02 ng/mL (ref 0.00–0.08)

## 2015-10-02 LAB — BASIC METABOLIC PANEL
ANION GAP: 10 (ref 5–15)
BUN: 14 mg/dL (ref 6–20)
CALCIUM: 9.6 mg/dL (ref 8.9–10.3)
CHLORIDE: 96 mmol/L — AB (ref 101–111)
CO2: 29 mmol/L (ref 22–32)
Creatinine, Ser: 1.2 mg/dL (ref 0.61–1.24)
GFR calc non Af Amer: >60 mL/min (ref >60)
GLUCOSE: 148 mg/dL — AB (ref 65–99)
POTASSIUM: 3.5 mmol/L (ref 3.5–5.1)
Sodium: 135 mmol/L (ref 135–145)

## 2015-10-02 LAB — CBC
HEMATOCRIT: 34.8 % — AB (ref 39.0–52.0)
HEMOGLOBIN: 11.3 g/dL — AB (ref 13.0–17.0)
MCH: 28.7 pg (ref 26.0–34.0)
MCHC: 32.5 g/dL (ref 30.0–36.0)
MCV: 88.3 fL (ref 78.0–100.0)
Platelets: 197 10*3/uL (ref 150–400)
RBC: 3.94 MIL/uL — AB (ref 4.22–5.81)
RDW: 13.8 % (ref 11.5–15.5)
WBC: 5.7 10*3/uL (ref 4.0–10.5)

## 2015-10-02 NOTE — ED Notes (Signed)
[facility] gave the pt 324 of ASA

## 2015-10-02 NOTE — ED Provider Notes (Signed)
CSN: [id number]     Arrival date & time 10/02/15  0004 History  By signing my name below, I, Tanda RockersMargaux Venter, attest that this documentation has been prepared under the direction and in the presence of Geoffery Lyonsouglas Syria Kestner, MD. Electronically Signed: Tanda RockersMargaux Venter, ED Scribe. 10/02/2015. 1:55 AM.   Chief Complaint  Patient presents with  . Arm Pain   The history is provided by the patient. No language interpreter was used.     HPI Comments: [name] is a 54 y.o. male who presents to the Emergency Department complaining of gradual onset, constant, chest pain that began tonight. Pt is currently chest pain free. Pt is also worried about his blood pressure being high. He states the antihypertensive medication he is on isn't working. He reports a blood pressure of 155/109 3 days ago. Per triage report, pt started complaining of chest pain at the shelter when there were no beds available. Denies shortness of breath, nausea, vomiting, diaphoresis, or any other associated symptoms.    Past Medical History  Diagnosis Date  . Bipolar 1 disorder (HCC)   . Hypertension   . Thyroid disease   . GERD (gastroesophageal reflux disease)   . Obstructive sleep apnea   . COPD (chronic obstructive pulmonary disease) (HCC)    History reviewed. No pertinent past surgical history. Family History  Problem Relation Age of Onset  . Cirrhosis Mother   . Hypertension Mother   . Diabetes Mellitus II Mother    Social History  Substance Use Topics  . Smoking status: Current Every Day Smoker -- 0.50 packs/day    Types: Cigarettes  . Smokeless tobacco: None  . Alcohol Use: Yes     Comment: about 2 beers every day.     Review of Systems  A complete 10 system review of systems was obtained and all systems are negative except as noted in the HPI and PMH.   Allergies  Review of patient's allergies indicates no known allergies.  Home Medications   Prior to Admission medications   Medication Sig Start Date End  Date Taking? Authorizing Provider  acetaminophen (TYLENOL) 325 MG tablet Take 2 tablets (650 mg total) by mouth every 6 (six) hours as needed for mild pain (or Fever >/= 101). Patient not taking: Reported on 07/08/2015 08/05/14   Tora KindredMarianne L York, PA-C  amLODipine (NORVASC) 10 MG tablet Take 10 mg by mouth daily.     Historical Provider, MD  divalproex (DEPAKOTE ER) 500 MG 24 hr tablet Take 500 mg by mouth 2 (two) times daily.    Historical Provider, MD  hydrochlorothiazide (HYDRODIURIL) 25 MG tablet Take 25 mg by mouth daily.     Historical Provider, MD  levothyroxine (SYNTHROID, LEVOTHROID) 75 MCG tablet Take 75 mcg by mouth daily before breakfast.     Historical Provider, MD  LORazepam (ATIVAN) 2 MG tablet Take 0.5 tablets (1 mg total) by mouth 3 (three) times daily. Patient not taking: Reported on 07/08/2015 08/05/14   Tora KindredMarianne L York, PA-C  LORazepam (ATIVAN) 2 MG tablet Take 0.5 tablets (1 mg total) by mouth every 4 (four) hours as needed (agitation). Maximum 2 prn doses in 24 hours Patient not taking: Reported on 07/08/2015 08/05/14   Tora KindredMarianne L York, PA-C  LORazepam (ATIVAN) 2 MG tablet Take 1 tablet (2 mg total) by mouth every 6 (six) hours as needed for anxiety. 07/08/15   Audery AmelJohn T Clapacs, MD  paliperidone (INVEGA) 3 MG 24 hr tablet Take 3 mg by mouth 2 (two) times  daily. In the morning and at bedtime    Historical Provider, MD  pantoprazole (PROTONIX) 40 MG tablet Take 1 tablet (40 mg total) by mouth daily. Patient not taking: Reported on 07/08/2015 08/05/14   Stephani Police, PA-C  zolpidem (AMBIEN) 10 MG tablet Take 1 tablet (10 mg total) by mouth at bedtime. Patient not taking: Reported on 07/08/2015 08/05/14   Stephani Police, PA-C   Triage VItals: BP 133/89 mmHg  Pulse 95  Temp(Src) 98.2 F (36.8 C) (Oral)  Resp 16  Ht  (1.702 m)  Wt 166 lb (75.297 kg)  BMI 25.99 kg/m2  SpO2 98%   Physical Exam  Constitutional: He is oriented to person, place, and time. He appears  well-developed and well-nourished.  HENT:  Head: Normocephalic and atraumatic.  Eyes: EOM are normal.  Neck: Normal range of motion.  Cardiovascular: Normal rate, regular rhythm, normal heart sounds and intact distal pulses.   Pulmonary/Chest: Effort normal and breath sounds normal. No respiratory distress.  Abdominal: Soft. He exhibits no distension. There is no tenderness.  Musculoskeletal: Normal range of motion.  Neurological: He is alert and oriented to person, place, and time.  Skin: Skin is warm and dry.  Psychiatric: He has a normal mood and affect. Judgment normal.  Nursing note and vitals reviewed.   ED Course  Procedures (including critical care time)  DIAGNOSTIC STUDIES: Oxygen Saturation is 98% on RA, normal by my interpretation.    COORDINATION OF CARE: 1:54 AM-Discussed treatment plan which includes repeat troponin with pt at bedside and pt agreed to plan.   Labs Review Labs Reviewed  BASIC METABOLIC PANEL - Abnormal; Notable for the following:    Chloride 96 (*)    Glucose, Bld 148 (*)    All other components within normal limits  CBC - Abnormal; Notable for the following:    RBC 3.94 (*)    Hemoglobin 11.3 (*)    HCT 34.8 (*)    All other components within normal limits  I-STAT TROPOININ, ED    Imaging Review No results found. I have personally reviewed and evaluated these lab results as part of my medical decision-making.   EKG Interpretation   Date/Time:  Thursday October 02 2015 00:17:42 EST Ventricular Rate:  98 PR Interval:  152 QRS Duration: 88 QT Interval:  374 QTC Calculation: 477 R Axis:   30 Text Interpretation:  Sinus rhythm Consider left ventricular hypertrophy  Borderline prolonged QT interval Confirmed by Judd Lien  MD, Deja Pisarski ([id number]) on  10/02/2015 4:59:46 AM      MDM   Final diagnoses:  None    Patient is a 54 year old male with history of hypertension, bipolar. He presents for evaluation of chest discomfort. Apparently  found out that the homeless shelter was at capacity and developed chest discomfort. He was transported here by ambulance for evaluation of this. He had a brief, 2 minute episode of discomfort in his chest that resolved prior to transport and has remained pain free throughout his emergency department stay. He has had 2 negative troponins and an unchanged EKG. I highly doubt a cardiac etiology and believe the patient is stable for discharge.  He has a psychiatric history and his affect is somewhat unusual, however he does not appear frankly psychotic and I do not feel poses a risk to himself or others. I see no reason for psychiatric hospitalization or consultation.   I personally performed the services described in this documentation, which was scribed in my presence. The  recorded information has been reviewed and is accurate.       Geoffery Lyons, MD 10/02/15 [phone]

## 2015-10-02 NOTE — ED Notes (Signed)
Per [facility]: Pt started c/o of pain when there were no beds available at the shelter for him to stay, he said his chest hurt, "it just hurts" and wanted to go to the hospital. Pt walked to truck and laid on the bed and stated "I don't have anymore chest pain, my arm hurts", EMS had not provided any interventions at this time. Pt then stated "My arm hurts all the time", left arm. Inappropriate laughter. Pt stated that he used to take Seroquel but has been out "for a while".

## 2015-10-02 NOTE — Discharge Instructions (Signed)
Nonspecific Chest Pain  °Chest pain can be caused by many different conditions. There is always a chance that your pain could be related to something serious, such as a heart attack or a blood clot in your lungs. Chest pain can also be caused by conditions that are not life-threatening. If you have chest pain, it is very important to follow up with your health care provider. °CAUSES  °Chest pain can be caused by: °· Heartburn. °· Pneumonia or bronchitis. °· Anxiety or stress. °· Inflammation around your heart (pericarditis) or lung (pleuritis or pleurisy). °· A blood clot in your lung. °· A collapsed lung (pneumothorax). It can develop suddenly on its own (spontaneous pneumothorax) or from trauma to the chest. °· Shingles infection (varicella-zoster virus). °· Heart attack. °· Damage to the bones, muscles, and cartilage that make up your chest wall. This can include: °¨ Bruised bones due to injury. °¨ Strained muscles or cartilage due to frequent or repeated coughing or overwork. °¨ Fracture to one or more ribs. °¨ Sore cartilage due to inflammation (costochondritis). °RISK FACTORS  °Risk factors for chest pain may include: °· Activities that increase your risk for trauma or injury to your chest. °· Respiratory infections or conditions that cause frequent coughing. °· Medical conditions or overeating that can cause heartburn. °· Heart disease or family history of heart disease. °· Conditions or health behaviors that increase your risk of developing a blood clot. °· Having had chicken pox (varicella zoster). °SIGNS AND SYMPTOMS °Chest pain can feel like: °· Burning or tingling on the surface of your chest or deep in your chest. °· Crushing, pressure, aching, or squeezing pain. °· Dull or sharp pain that is worse when you move, cough, or take a deep breath. °· Pain that is also felt in your back, neck, shoulder, or arm, or pain that spreads to any of these areas. °Your chest pain may come and go, or it may stay  constant. °DIAGNOSIS °Lab tests or other studies may be needed to find the cause of your pain. Your health care provider may have you take a test called an ambulatory ECG (electrocardiogram). An ECG records your heartbeat patterns at the time the test is performed. You may also have other tests, such as: °· Transthoracic echocardiogram (TTE). During echocardiography, sound waves are used to create a picture of all of the heart structures and to look at how blood flows through your heart. °· Transesophageal echocardiogram (TEE). This is a more advanced imaging test that obtains images from inside your body. It allows your health care provider to see your heart in finer detail. °· Cardiac monitoring. This allows your health care provider to monitor your heart rate and rhythm in real time. °· Holter monitor. This is a portable device that records your heartbeat and can help to diagnose abnormal heartbeats. It allows your health care provider to track your heart activity for several days, if needed. °· Stress tests. These can be done through exercise or by taking medicine that makes your heart beat more quickly. °· Blood tests. °· Imaging tests. °TREATMENT  °Your treatment depends on what is causing your chest pain. Treatment may include: °· Medicines. These may include: °¨ Acid blockers for heartburn. °¨ Anti-inflammatory medicine. °¨ Pain medicine for inflammatory conditions. °¨ Antibiotic medicine, if an infection is present. °¨ Medicines to dissolve blood clots. °¨ Medicines to treat coronary artery disease. °· Supportive care for conditions that do not require medicines. This may include: °¨ Resting. °¨ Applying heat   or cold packs to injured areas. °¨ Limiting activities until pain decreases. °HOME CARE INSTRUCTIONS °· If you were prescribed an antibiotic medicine, finish it all even if you start to feel better. °· Avoid any activities that bring on chest pain. °· Do not use any tobacco products, including  cigarettes, chewing tobacco, or electronic cigarettes. If you need help quitting, ask your health care provider. °· Do not drink alcohol. °· Take medicines only as directed by your health care provider. °· Keep all follow-up visits as directed by your health care provider. This is important. This includes any further testing if your chest pain does not go away. °· If heartburn is the cause for your chest pain, you may be told to keep your head raised (elevated) while sleeping. This reduces the chance that acid will go from your stomach into your esophagus. °· Make lifestyle changes as directed by your health care provider. These may include: °¨ Getting regular exercise. Ask your health care provider to suggest some activities that are safe for you. °¨ Eating a heart-healthy diet. A registered dietitian can help you to learn healthy eating options. °¨ Maintaining a healthy weight. °¨ Managing diabetes, if necessary. °¨ Reducing stress. °SEEK MEDICAL CARE IF: °· Your chest pain does not go away after treatment. °· You have a rash with blisters on your chest. °· You have a fever. °SEEK IMMEDIATE MEDICAL CARE IF:  °· Your chest pain is worse. °· You have an increasing cough, or you cough up blood. °· You have severe abdominal pain. °· You have severe weakness. °· You faint. °· You have chills. °· You have sudden, unexplained chest discomfort. °· You have sudden, unexplained discomfort in your arms, back, neck, or jaw. °· You have shortness of breath at any time. °· You suddenly start to sweat, or your skin gets clammy. °· You feel nauseous or you vomit. °· You suddenly feel light-headed or dizzy. °· Your heart begins to beat quickly, or it feels like it is skipping beats. °These symptoms may represent a serious problem that is an emergency. Do not wait to see if the symptoms will go away. Get medical help right away. Call your local emergency services (911 in the [place] Do not drive yourself to the hospital. °  °This  information is not intended to replace advice given to you by your health care provider. Make sure you discuss any questions you have with your health care provider. °  °Document Released: 07/21/2005 Document Revised: 11/01/2014 Document Reviewed: 05/17/2014 °[employer] Interactive Patient Education ©2016 [employer] ° °

## 2016-04-09 ENCOUNTER — Encounter (HOSPITAL_COMMUNITY): Payer: Self-pay | Admitting: Emergency Medicine

## 2016-04-09 ENCOUNTER — Emergency Department (HOSPITAL_COMMUNITY)
Admission: EM | Admit: 2016-04-09 | Discharge: 2016-04-09 | Disposition: A | Discharge status: Home / Self Care | Payer: Medicare Other | Attending: Emergency Medicine | Admitting: Emergency Medicine

## 2016-04-09 DIAGNOSIS — J449 Chronic obstructive pulmonary disease, unspecified: Secondary | ICD-10-CM | POA: Diagnosis not present

## 2016-04-09 DIAGNOSIS — Z79899 Other long term (current) drug therapy: Secondary | ICD-10-CM | POA: Diagnosis not present

## 2016-04-09 DIAGNOSIS — F17210 Nicotine dependence, cigarettes, uncomplicated: Secondary | ICD-10-CM | POA: Diagnosis not present

## 2016-04-09 DIAGNOSIS — I10 Essential (primary) hypertension: Secondary | ICD-10-CM | POA: Diagnosis not present

## 2016-04-09 DIAGNOSIS — Z760 Encounter for issue of repeat prescription: Secondary | ICD-10-CM | POA: Insufficient documentation

## 2016-04-09 DIAGNOSIS — F259 Schizoaffective disorder, unspecified: Secondary | ICD-10-CM

## 2016-04-09 HISTORY — DX: Homelessness: Z59.0

## 2016-04-09 HISTORY — DX: Homelessness unspecified: Z59.00

## 2016-04-09 LAB — COMPREHENSIVE METABOLIC PANEL
ALBUMIN: 3.6 g/dL (ref 3.5–5.0)
ALK PHOS: 101 U/L (ref 38–126)
ALT: 44 U/L (ref 17–63)
ANION GAP: 14 (ref 5–15)
AST: 35 U/L (ref 15–41)
BILIRUBIN TOTAL: 0.3 mg/dL (ref 0.3–1.2)
BUN: 10 mg/dL (ref 6–20)
CO2: 23 mmol/L (ref 22–32)
CREATININE: 1.34 mg/dL — AB (ref 0.61–1.24)
Calcium: 9.3 mg/dL (ref 8.9–10.3)
Chloride: 93 mmol/L — ABNORMAL LOW (ref 101–111)
GFR calc Af Amer: >60 mL/min (ref >60)
GFR calc non Af Amer: 58 mL/min — ABNORMAL LOW (ref >60)
GLUCOSE: 181 mg/dL — AB (ref 65–99)
Potassium: 2.9 mmol/L — ABNORMAL LOW (ref 3.5–5.1)
SODIUM: 130 mmol/L — AB (ref 135–145)
TOTAL PROTEIN: 6.7 g/dL (ref 6.5–8.1)

## 2016-04-09 LAB — CBC WITH DIFFERENTIAL/PLATELET
BASOS ABS: 0 10*3/uL (ref 0.0–0.1)
Basophils Relative: 0 %
Eosinophils Absolute: 0 10*3/uL (ref 0.0–0.7)
Eosinophils Relative: 1 %
HEMATOCRIT: 33.3 % — AB (ref 39.0–52.0)
HEMOGLOBIN: 10.7 g/dL — AB (ref 13.0–17.0)
Lymphocytes Relative: 40 %
Lymphs Abs: 2 10*3/uL (ref 0.7–4.0)
MCH: 26.3 pg (ref 26.0–34.0)
MCHC: 32.1 g/dL (ref 30.0–36.0)
MCV: 81.8 fL (ref 78.0–100.0)
MONOS PCT: 12 %
Monocytes Absolute: 0.6 10*3/uL (ref 0.1–1.0)
NEUTROS ABS: 2.3 10*3/uL (ref 1.7–7.7)
Neutrophils Relative %: 47 %
PLATELETS: 212 10*3/uL (ref 150–400)
RBC: 4.07 MIL/uL — AB (ref 4.22–5.81)
RDW: 15.2 % (ref 11.5–15.5)
WBC: 4.9 10*3/uL (ref 4.0–10.5)

## 2016-04-09 LAB — RAPID URINE DRUG SCREEN, HOSP PERFORMED
AMPHETAMINES: NOT DETECTED
BARBITURATES: NOT DETECTED
BENZODIAZEPINES: NOT DETECTED
COCAINE: NOT DETECTED
Opiates: NOT DETECTED
Tetrahydrocannabinol: NOT DETECTED

## 2016-04-09 LAB — ETHANOL: Alcohol, Ethyl (B): <5 mg/dL (ref <5)

## 2016-04-09 MED ORDER — POTASSIUM CHLORIDE CRYS ER 20 MEQ PO TBCR
40.0000 meq | EXTENDED_RELEASE_TABLET | Freq: Once | ORAL | Status: AC
  Administered 2016-04-09: 40 meq via ORAL
  Filled 2016-04-09: qty 2

## 2016-04-09 NOTE — Discharge Instructions (Signed)
Schizoaffective Disorder  Schizoaffective disorder (ScAD) is a mental illness. It causes symptoms that are a mixture of schizophrenia (a psychotic disorder) and an affective (mood) disorder. The schizophrenic symptoms may include delusions, hallucinations, or odd behavior. The mood symptoms may be similar to major depression or bipolar disorder. ScAD may interfere with personal relationships or normal daily activities. People with ScAD are at increased risk for job loss, social isolation, physical health problems, anxiety and substance use disorders, and suicide.  ScAD usually occurs in cycles. Periods of severe symptoms are followed by periods of  less severe symptoms or improvement. The illness affects men and women equally but usually appears at an earlier age (teenage or early adult years) in men. People who have family members with schizophrenia, bipolar disorder, or ScAD are at higher risk of developing ScAD.  SYMPTOMS   At any one time, people with ScAD may have psychotic symptoms only or both psychotic and mood symptoms. The psychotic symptoms include one or more of the following:  · Hearing, seeing, or feeling things that are not there (hallucinations).    · Having fixed, false beliefs (delusions). The delusions usually are of being attacked, harassed, cheated, persecuted, or conspired against (paranoid delusions).  · Speaking in a way that makes no sense to others (disorganized speech).  The psychotic symptoms of ScAD may also include confusing or odd behavior or any of the negative symptoms of schizophrenia. These include loss of motivation for normal daily activities, such as bathing or grooming, withdrawal from other people, and lack of emotions.     The mood symptoms of ScAD occur more often than not. They resemble major depressive disorder or bipolar mania. Symptoms of major depression include depressed mood and four or more of the following:  · Loss of interest in usually pleasurable activities  (anhedonia).  · Sleeping more or less than normal.  · Feeling worthless or excessively guilty.  · Lack of energy or motivation.  · Trouble concentrating.  · Eating more or less than usual.  · Thinking a lot about death or suicide.  Symptoms of bipolar mania include abnormally elevated or irritable mood and increased energy or activity, plus three or more of the following:    · More confidence than normal or feeling that you are able to do anything (grandiosity).  · Feeling rested with less sleep than normal.    · Being easily distracted.    · Talking more than usual or feeling pressured to keep talking.    · Feeling that your thoughts are racing.  · Engaging in high-risk activities such as buying sprees or foolish business decisions.  DIAGNOSIS   ScAD is diagnosed through an assessment by your health care provider. Your health care provider will observe and ask questions about your thoughts, behavior, mood, and ability to function in daily life. Your health care provider may also ask questions about your medical history and use of drugs, including prescription medicines. Your health care provider may also order blood tests and imaging exams. Certain medical conditions and substances can cause symptoms that resemble ScAD. Your health care provider may refer you to a mental health specialist for evaluation.   ScAD is divided into two types. The depressive type is diagnosed if your mood symptoms are limited to major depression. The bipolar type is diagnosed if your mood symptoms are manic or a mixture of manic and depressive symptoms  TREATMENT   ScAD is usually a lifelong illness. Long-term treatment is necessary. The following treatments are available:  · Medicine. Different types of   medicine are used to treat ScAD. The exact combination depends on the type and severity of your symptoms. Antipsychotic medicine is used to control psychotic symptoms such as delusions, paranoia, and hallucinations. Mood stabilizers can  even the highs and lows of bipolar manic mood swings. Antidepressant medicines are used to treat major depressive symptoms.  · Counseling or talk therapy. Individual, group, or family counseling may be helpful in providing education, support, and guidance. Many people with ScAD also benefit from social skills and job skills (vocational) training.  A combination of medicine and counseling is usually best for managing the disorder over time. A procedure in which electricity is applied to the brain through the scalp (electroconvulsive therapy) may be used to treat people with severe manic symptoms that do not respond to medicine and counseling.  HOME CARE INSTRUCTIONS   · Take all your medicine as prescribed.  · Check with your health care provider before starting new prescription or over-the-counter medicines.  · Keep all follow up appointments with your health care provider.  SEEK MEDICAL CARE IF:   · If you are not able to take your medicines as prescribed.  · If your symptoms get worse.  SEEK IMMEDIATE MEDICAL CARE IF:   · You have serious thoughts about hurting yourself or others.     This information is not intended to replace advice given to you by your health care provider. Make sure you discuss any questions you have with your health care provider.     Document Released: 02/21/2007 Document Revised: 11/01/2014 Document Reviewed: 05/25/2013  Elsevier Interactive Patient Education ©2016 [employer]

## 2016-04-09 NOTE — ED Notes (Signed)
[place] sandwich given per [employer] Pride[employer]oke, RCharity fundraiser

## 2016-04-09 NOTE — ED Notes (Signed)
Secretary states case manager spoke with social worker who will come speak with patient.

## 2016-04-09 NOTE — ED Notes (Signed)
[facility] patient sitting on stretcher relaxing waiting to speak to a Child psychotherapistocial Worker

## 2016-04-09 NOTE — Discharge Planning (Addendum)
Avi Kerschner J. Lucretia RoersWood, RN, BSN, [employer] CorporationCM [phone] [facility] set [facility] appointment with Julianne HandlerLachina Hollis, FNP to establish care on 7/3 @ 2:00.  Spoke with pt at bedside and provided brochure with directions and phone number highlighted.  Pt verbalizes understanding of keeping appointment.  Pt states he is homeless and needs shelter possibly at SNF ([place] sister in [place] but does not want to burden her any longer).  EDCM explained process of SNF placement from ED and hospital and at this time, pt does not qualify.  Pt verbalized understanding.  EDCM relayed information to SW covering ED today.

## 2016-04-09 NOTE — ED Notes (Signed)
Pt. arrived with [facility] requesting prescriptions for his antihypertensive medications, pt. stated  " I lost it " , pt. also requesting nursing home placement . He is homeless.

## 2016-04-09 NOTE — ED Provider Notes (Signed)
CSN: [id number]     Arrival date & time 04/09/16  0449 History   First MD Initiated Contact with Patient 04/09/16 [phone]     Chief Complaint  Patient presents with  . Medication Refill   LEVEL 5 CAVEAT DUE TO PSYCHIATRIC DISORDER  HPI  Patient presents for placement.  He reports he would like to go back to assisted living ([facility]).  He reports that he feels better while in group home as someone can give him his medications.  He reports he has been staying with his sister in [place], but he would rather stay here.  He reports that he has starting drinking ETOH in past 3 days.  He denies drug use. He denies Ha/fever/vomiting He has no CP/SOB He denies SI/HI He does feel like he needs someone to monitor his meds as he has not been taking his meds    Past Medical History  Diagnosis Date  . Bipolar 1 disorder (HCC)   . Hypertension   . Thyroid disease   . GERD (gastroesophageal reflux disease)   . Obstructive sleep apnea   . COPD (chronic obstructive pulmonary disease) (HCC)   . Homelessness    History reviewed. No pertinent past surgical history. Family History  Problem Relation Age of Onset  . Cirrhosis Mother   . Hypertension Mother   . Diabetes Mellitus II Mother    Social History  Substance Use Topics  . Smoking status: Current Every Day Smoker -- 0.00 packs/day    Types: Cigarettes  . Smokeless tobacco: None  . Alcohol Use: Yes    Review of Systems  Unable to perform ROS: Psychiatric disorder      Allergies  Review of patient's allergies indicates no known allergies.  Home Medications   Prior to Admission medications   Medication Sig Start Date End Date Taking? Authorizing Provider  amLODipine (NORVASC) 10 MG tablet Take 10 mg by mouth daily.    Yes Historical Provider, MD  divalproex (DEPAKOTE ER) 500 MG 24 hr tablet Take 500 mg by mouth 2 (two) times daily.   Yes Historical Provider, MD  hydrochlorothiazide (HYDRODIURIL) 25 MG tablet Take 25 mg by  mouth daily.    Yes Historical Provider, MD  levothyroxine (SYNTHROID, LEVOTHROID) 75 MCG tablet Take 75 mcg by mouth daily before breakfast.    Yes Historical Provider, MD  metoprolol (LOPRESSOR) 50 MG tablet Take 50 mg by mouth 2 (two) times daily. 04/03/16  Yes Historical Provider, MD  paliperidone (INVEGA) 3 MG 24 hr tablet Take 3 mg by mouth 2 (two) times daily. In the morning and at bedtime   Yes Historical Provider, MD   BP 145/105 mmHg  Pulse 110  Temp(Src) 98.7 F (37.1 C) (Oral)  Resp 22  Ht  (1.702 m)  Wt 79.379 kg  BMI 27.40 kg/m2  SpO2 99% Physical Exam CONSTITUTIONAL: Disheveled, no acute distress HEAD: Normocephalic/atraumatic EYES: EOMI ENMT: Mucous membranes dry NECK: supple no meningeal signs SPINE/BACK:entire spine nontender CV: S1/S2 noted, no murmurs/rubs/gallops noted LUNGS: Lungs are clear to auscultation bilaterally, no apparent distress ABDOMEN: soft, nontender NEURO: Pt is awake/alert/appropriate, moves all extremitiesx4.  Mild tremor noted in his hands.   EXTREMITIES: pulses normal/equal, full ROM SKIN: warm, color normal PSYCH: pt is very anxious  ED Course  Procedures   6:22 AM Pt initially in the ED for medication refill.  After further discussion he reports he is now homeless and would like to go back to assisted living.  He reports  [facility].  He denies active SI.    8:12 AM I attempted to call sister Ivor MessierCora [phone]) but no answer at this time It appears his sister is his guardian  Will consult social work to assist in getting patient to sister in [place], or if he can be placed in facility Signed out to dr Juleen Chinakohut at shift change  Labs Review Labs Reviewed  COMPREHENSIVE METABOLIC PANEL - Abnormal; Notable for the following:    Sodium 130 (*)    Potassium 2.9 (*)    Chloride 93 (*)    Glucose, Bld 181 (*)    Creatinine, Ser 1.34 (*)    GFR calc non Af Amer 58 (*)    All other components within normal  limits  CBC WITH DIFFERENTIAL/PLATELET - Abnormal; Notable for the following:    RBC 4.07 (*)    Hemoglobin 10.7 (*)    HCT 33.3 (*)    All other components within normal limits  ETHANOL  URINE RAPID DRUG SCREEN, HOSP PERFORMED    I have personally reviewed and evaluated these  lab results as part of my medical decision-making.    MDM   Final diagnoses:  Schizoaffective disorder, unspecified type [facility](HCC)    Nursing notes including past medical history and social history reviewed and considered in documentation Labs/vital reviewed myself and considered during evaluation     Zadie Rhineonald Branch Pacitti, MD 04/09/16 [phone]

## 2016-04-09 NOTE — ED Notes (Signed)
Pt provided with water upon request.

## 2016-04-09 NOTE — ED Notes (Signed)
Sister of patient called stated will have social worker call her when at bedside of patient. CORA [phone]

## 2016-04-26 ENCOUNTER — Ambulatory Visit: Payer: Self-pay | Admitting: Family Medicine

## 2022-10-08 ENCOUNTER — Other Ambulatory Visit: Payer: Self-pay

## 2023-03-26 DEATH — deceased
